# Patient Record
Sex: Male | Born: 1935 | Race: White | Hispanic: No | Marital: Married | Smoking: Never smoker
Health system: Southern US, Community
[De-identification: ages and names within clinical notes are randomized; demographics above are authoritative.]

## PROBLEM LIST (undated history)

## (undated) DIAGNOSIS — G629 Polyneuropathy, unspecified: Secondary | ICD-10-CM

## (undated) DIAGNOSIS — N183 Chronic kidney disease, stage 3 unspecified: Secondary | ICD-10-CM

## (undated) DIAGNOSIS — C61 Malignant neoplasm of prostate: Secondary | ICD-10-CM

## (undated) DIAGNOSIS — I10 Essential (primary) hypertension: Secondary | ICD-10-CM

## (undated) DIAGNOSIS — E039 Hypothyroidism, unspecified: Secondary | ICD-10-CM

## (undated) DIAGNOSIS — S72001A Fracture of unspecified part of neck of right femur, initial encounter for closed fracture: Secondary | ICD-10-CM

## (undated) DIAGNOSIS — J302 Other seasonal allergic rhinitis: Secondary | ICD-10-CM

## (undated) DIAGNOSIS — G2581 Restless legs syndrome: Secondary | ICD-10-CM

## (undated) DIAGNOSIS — I639 Cerebral infarction, unspecified: Secondary | ICD-10-CM

## (undated) DIAGNOSIS — D649 Anemia, unspecified: Secondary | ICD-10-CM

## (undated) DIAGNOSIS — K2970 Gastritis, unspecified, without bleeding: Secondary | ICD-10-CM

## (undated) DIAGNOSIS — I5032 Chronic diastolic (congestive) heart failure: Secondary | ICD-10-CM

## (undated) HISTORY — PX: CARPAL TUNNEL RELEASE: SHX101

## (undated) HISTORY — DX: Malignant neoplasm of prostate: C61

## (undated) HISTORY — DX: Polyneuropathy, unspecified: G62.9

## (undated) HISTORY — DX: Restless legs syndrome: G25.81

## (undated) HISTORY — PX: TRANSURETHRAL RESECTION OF PROSTATE: SHX73

## (undated) HISTORY — DX: Cerebral infarction, unspecified: I63.9

## (undated) HISTORY — DX: Gastritis, unspecified, without bleeding: K29.70

## (undated) HISTORY — DX: Other seasonal allergic rhinitis: J30.2

## (undated) HISTORY — PX: UPPER GI ENDOSCOPY: SHX6162

## (undated) HISTORY — PX: HERNIA REPAIR: SHX51

## (undated) HISTORY — PX: CATARACT EXTRACTION: SUR2

## (undated) HISTORY — PX: APPENDECTOMY: SHX54

## (undated) HISTORY — PX: COLONOSCOPY: SHX174

## (undated) HISTORY — DX: Essential (primary) hypertension: I10

## (undated) HISTORY — DX: Anemia, unspecified: D64.9

---

## 2004-07-26 ENCOUNTER — Ambulatory Visit: Payer: Self-pay | Admitting: Chiropractic Medicine

## 2004-12-21 ENCOUNTER — Ambulatory Visit: Payer: Self-pay | Admitting: Neurology

## 2007-01-20 ENCOUNTER — Ambulatory Visit: Payer: Self-pay | Admitting: Family Medicine

## 2008-01-26 ENCOUNTER — Ambulatory Visit: Payer: Self-pay | Admitting: Unknown Physician Specialty

## 2008-04-13 ENCOUNTER — Ambulatory Visit: Payer: Self-pay | Admitting: Nephrology

## 2008-08-27 ENCOUNTER — Ambulatory Visit: Payer: Self-pay | Admitting: Internal Medicine

## 2008-08-30 ENCOUNTER — Ambulatory Visit: Payer: Self-pay | Admitting: Internal Medicine

## 2008-09-30 ENCOUNTER — Ambulatory Visit: Payer: Self-pay | Admitting: Internal Medicine

## 2008-10-05 ENCOUNTER — Ambulatory Visit: Payer: Self-pay | Admitting: Internal Medicine

## 2008-10-30 ENCOUNTER — Ambulatory Visit: Payer: Self-pay | Admitting: Internal Medicine

## 2008-11-30 ENCOUNTER — Ambulatory Visit: Payer: Self-pay | Admitting: Internal Medicine

## 2008-12-30 ENCOUNTER — Ambulatory Visit: Payer: Self-pay | Admitting: Internal Medicine

## 2009-01-30 ENCOUNTER — Ambulatory Visit: Payer: Self-pay | Admitting: Internal Medicine

## 2009-03-02 ENCOUNTER — Ambulatory Visit: Payer: Self-pay | Admitting: Internal Medicine

## 2009-04-01 ENCOUNTER — Ambulatory Visit: Payer: Self-pay | Admitting: Internal Medicine

## 2009-05-02 ENCOUNTER — Ambulatory Visit: Payer: Self-pay | Admitting: Internal Medicine

## 2009-06-01 ENCOUNTER — Ambulatory Visit: Payer: Self-pay | Admitting: Internal Medicine

## 2009-07-02 ENCOUNTER — Ambulatory Visit: Payer: Self-pay | Admitting: Internal Medicine

## 2009-08-02 ENCOUNTER — Ambulatory Visit: Payer: Self-pay | Admitting: Internal Medicine

## 2009-08-30 ENCOUNTER — Ambulatory Visit: Payer: Self-pay | Admitting: Internal Medicine

## 2009-09-30 ENCOUNTER — Ambulatory Visit: Payer: Self-pay | Admitting: Internal Medicine

## 2009-10-30 ENCOUNTER — Ambulatory Visit: Payer: Self-pay | Admitting: Internal Medicine

## 2009-11-30 ENCOUNTER — Ambulatory Visit: Payer: Self-pay | Admitting: Internal Medicine

## 2009-12-30 ENCOUNTER — Ambulatory Visit: Payer: Self-pay | Admitting: Internal Medicine

## 2010-01-30 ENCOUNTER — Ambulatory Visit: Payer: Self-pay | Admitting: Internal Medicine

## 2010-02-28 ENCOUNTER — Encounter: Payer: Self-pay | Admitting: Orthopedic Surgery

## 2010-03-02 ENCOUNTER — Ambulatory Visit: Payer: Self-pay | Admitting: Internal Medicine

## 2010-03-02 ENCOUNTER — Encounter: Payer: Self-pay | Admitting: Orthopedic Surgery

## 2010-04-01 ENCOUNTER — Ambulatory Visit: Payer: Self-pay | Admitting: Internal Medicine

## 2010-05-02 ENCOUNTER — Ambulatory Visit: Payer: Self-pay | Admitting: Internal Medicine

## 2010-06-01 ENCOUNTER — Ambulatory Visit: Payer: Self-pay | Admitting: Internal Medicine

## 2010-07-02 ENCOUNTER — Ambulatory Visit: Payer: Self-pay | Admitting: Internal Medicine

## 2010-08-02 ENCOUNTER — Ambulatory Visit: Payer: Self-pay | Admitting: Internal Medicine

## 2010-09-07 ENCOUNTER — Ambulatory Visit: Payer: Self-pay | Admitting: Internal Medicine

## 2010-10-01 ENCOUNTER — Ambulatory Visit: Payer: Self-pay | Admitting: Internal Medicine

## 2010-11-03 ENCOUNTER — Ambulatory Visit: Payer: Self-pay | Admitting: Internal Medicine

## 2010-12-01 ENCOUNTER — Ambulatory Visit: Payer: Self-pay | Admitting: Internal Medicine

## 2010-12-31 ENCOUNTER — Ambulatory Visit: Payer: Self-pay | Admitting: Internal Medicine

## 2011-02-23 ENCOUNTER — Ambulatory Visit: Payer: Self-pay | Admitting: Internal Medicine

## 2011-03-03 ENCOUNTER — Ambulatory Visit: Payer: Self-pay | Admitting: Internal Medicine

## 2011-04-20 ENCOUNTER — Ambulatory Visit: Payer: Self-pay | Admitting: Internal Medicine

## 2011-05-03 ENCOUNTER — Ambulatory Visit: Payer: Self-pay | Admitting: Internal Medicine

## 2011-06-15 ENCOUNTER — Ambulatory Visit: Payer: Self-pay | Admitting: Internal Medicine

## 2011-07-03 ENCOUNTER — Ambulatory Visit: Payer: Self-pay | Admitting: Internal Medicine

## 2011-08-10 ENCOUNTER — Ambulatory Visit: Payer: Self-pay | Admitting: Internal Medicine

## 2011-08-10 LAB — CANCER CENTER HEMOGLOBIN: HGB: 10.7 g/dL — ABNORMAL LOW (ref 13.0–18.0)

## 2011-08-31 ENCOUNTER — Ambulatory Visit: Payer: Self-pay | Admitting: Internal Medicine

## 2011-10-05 ENCOUNTER — Ambulatory Visit: Payer: Self-pay | Admitting: Internal Medicine

## 2011-10-05 LAB — CBC CANCER CENTER
Basophil %: 1.4 %
Eosinophil #: 0.1 x10 3/mm (ref 0.0–0.7)
Lymphocyte #: 2.2 x10 3/mm (ref 1.0–3.6)
Lymphocyte %: 30.7 %
MCV: 92 fL (ref 80–100)
Monocyte #: 0.4 x10 3/mm (ref 0.0–0.7)
Monocyte %: 6 %
Neutrophil #: 4.3 x10 3/mm (ref 1.4–6.5)
Neutrophil %: 60.1 %
Platelet: 307 x10 3/mm (ref 150–440)
RBC: 3.29 10*6/uL — ABNORMAL LOW (ref 4.40–5.90)
RDW: 15.1 % — ABNORMAL HIGH (ref 11.5–14.5)
WBC: 7.2 x10 3/mm (ref 3.8–10.6)

## 2011-10-05 LAB — FERRITIN: Ferritin (ARMC): 54 ng/mL (ref 8–388)

## 2011-10-05 LAB — RETICULOCYTES: Reticulocyte: 1 % (ref 0.5–1.5)

## 2011-10-31 ENCOUNTER — Ambulatory Visit: Payer: Self-pay | Admitting: Internal Medicine

## 2011-11-30 ENCOUNTER — Ambulatory Visit: Payer: Self-pay | Admitting: Family Medicine

## 2011-12-01 ENCOUNTER — Ambulatory Visit: Payer: Self-pay | Admitting: Internal Medicine

## 2012-01-04 ENCOUNTER — Ambulatory Visit: Payer: Self-pay | Admitting: Internal Medicine

## 2012-01-04 LAB — CANCER CENTER HEMOGLOBIN: HGB: 10.7 g/dL — ABNORMAL LOW (ref 13.0–18.0)

## 2012-01-07 ENCOUNTER — Ambulatory Visit: Payer: Self-pay | Admitting: Ophthalmology

## 2012-01-07 LAB — CBC WITH DIFFERENTIAL/PLATELET
Basophil #: 0 10*3/uL (ref 0.0–0.1)
Basophil %: 0.7 %
Eosinophil %: 1.4 %
HCT: 31.8 % — ABNORMAL LOW (ref 40.0–52.0)
HGB: 10.5 g/dL — ABNORMAL LOW (ref 13.0–18.0)
Lymphocyte #: 1.9 10*3/uL (ref 1.0–3.6)
Lymphocyte %: 29 %
MCH: 30.3 pg (ref 26.0–34.0)
MCV: 92 fL (ref 80–100)
Monocyte %: 10.5 %
Neutrophil #: 3.8 10*3/uL (ref 1.4–6.5)
RDW: 15.7 % — ABNORMAL HIGH (ref 11.5–14.5)
WBC: 6.5 10*3/uL (ref 3.8–10.6)

## 2012-01-15 ENCOUNTER — Ambulatory Visit: Payer: Self-pay | Admitting: Ophthalmology

## 2012-01-31 ENCOUNTER — Ambulatory Visit: Payer: Self-pay | Admitting: Internal Medicine

## 2012-02-12 LAB — CANCER CENTER HEMOGLOBIN: HGB: 10.8 g/dL — ABNORMAL LOW (ref 13.0–18.0)

## 2012-03-02 ENCOUNTER — Ambulatory Visit: Payer: Self-pay | Admitting: Internal Medicine

## 2012-03-21 LAB — FERRITIN: Ferritin (ARMC): 51 ng/mL (ref 8–388)

## 2012-03-21 LAB — CANCER CENTER HEMOGLOBIN: HGB: 11.3 g/dL — ABNORMAL LOW (ref 13.0–18.0)

## 2012-03-21 LAB — FOLATE: Folic Acid: 87.7 ng/mL (ref 3.1–100.0)

## 2012-03-21 LAB — IRON AND TIBC: Iron Saturation: 23 %

## 2012-04-01 ENCOUNTER — Ambulatory Visit: Payer: Self-pay | Admitting: Internal Medicine

## 2012-04-22 ENCOUNTER — Ambulatory Visit: Payer: Self-pay | Admitting: Ophthalmology

## 2012-05-02 ENCOUNTER — Ambulatory Visit: Payer: Self-pay | Admitting: Internal Medicine

## 2012-05-02 LAB — CBC CANCER CENTER
Basophil #: 0 x10 3/mm (ref 0.0–0.1)
Eosinophil #: 0.2 x10 3/mm (ref 0.0–0.7)
HGB: 11.1 g/dL — ABNORMAL LOW (ref 13.0–18.0)
Lymphocyte #: 4.2 x10 3/mm — ABNORMAL HIGH (ref 1.0–3.6)
Lymphocyte %: 47.2 %
MCHC: 32.9 g/dL (ref 32.0–36.0)
Monocyte %: 7.9 %
Neutrophil %: 42.2 %
Platelet: 307 x10 3/mm (ref 150–440)
RDW: 14.9 % — ABNORMAL HIGH (ref 11.5–14.5)
WBC: 8.9 x10 3/mm (ref 3.8–10.6)

## 2012-05-08 ENCOUNTER — Ambulatory Visit: Payer: Self-pay | Admitting: Gastroenterology

## 2012-06-01 ENCOUNTER — Ambulatory Visit: Payer: Self-pay | Admitting: Internal Medicine

## 2012-06-13 LAB — CANCER CENTER HEMOGLOBIN: HGB: 11.2 g/dL — ABNORMAL LOW (ref 13.0–18.0)

## 2012-07-02 ENCOUNTER — Ambulatory Visit: Payer: Self-pay | Admitting: Internal Medicine

## 2012-07-02 DIAGNOSIS — I639 Cerebral infarction, unspecified: Secondary | ICD-10-CM

## 2012-07-02 HISTORY — DX: Cerebral infarction, unspecified: I63.9

## 2012-07-09 ENCOUNTER — Ambulatory Visit: Payer: Self-pay | Admitting: Family Medicine

## 2012-07-10 ENCOUNTER — Other Ambulatory Visit: Payer: Self-pay

## 2012-07-10 LAB — CBC WITH DIFFERENTIAL/PLATELET
Basophil #: 0 10*3/uL (ref 0.0–0.1)
Basophil %: 0.4 %
Eosinophil #: 0.1 10*3/uL (ref 0.0–0.7)
Eosinophil %: 1.7 %
HCT: 33.7 % — ABNORMAL LOW (ref 40.0–52.0)
MCHC: 32.3 g/dL (ref 32.0–36.0)
MCV: 94 fL (ref 80–100)
Monocyte %: 7.6 %
Neutrophil %: 46.4 %
Platelet: 349 10*3/uL (ref 150–440)
RBC: 3.59 10*6/uL — ABNORMAL LOW (ref 4.40–5.90)
RDW: 14.3 % (ref 11.5–14.5)
WBC: 8.7 10*3/uL (ref 3.8–10.6)

## 2012-07-10 LAB — COMPREHENSIVE METABOLIC PANEL
Anion Gap: 6 — ABNORMAL LOW (ref 7–16)
Bilirubin,Total: 0.5 mg/dL (ref 0.2–1.0)
Calcium, Total: 9.1 mg/dL (ref 8.5–10.1)
Chloride: 98 mmol/L (ref 98–107)
Co2: 30 mmol/L (ref 21–32)
Creatinine: 1.91 mg/dL — ABNORMAL HIGH (ref 0.60–1.30)
EGFR (African American): 35 — ABNORMAL LOW
EGFR (Non-African Amer.): 30 — ABNORMAL LOW
Osmolality: 272 (ref 275–301)
SGOT(AST): 26 U/L (ref 15–37)
SGPT (ALT): 19 U/L (ref 12–78)
Sodium: 134 mmol/L — ABNORMAL LOW (ref 136–145)

## 2012-07-25 LAB — CANCER CENTER HEMOGLOBIN: HGB: 11.9 g/dL — ABNORMAL LOW (ref 13.0–18.0)

## 2012-08-02 ENCOUNTER — Ambulatory Visit: Payer: Self-pay | Admitting: Internal Medicine

## 2012-09-05 ENCOUNTER — Ambulatory Visit: Payer: Self-pay | Admitting: Internal Medicine

## 2012-09-24 ENCOUNTER — Encounter: Payer: Self-pay | Admitting: Cardiovascular Disease

## 2012-09-24 ENCOUNTER — Ambulatory Visit (INDEPENDENT_AMBULATORY_CARE_PROVIDER_SITE_OTHER): Payer: Medicare HMO | Admitting: Cardiovascular Disease

## 2012-09-24 VITALS — BP 150/62 | HR 78 | Ht 62.0 in | Wt 159.2 lb

## 2012-09-24 DIAGNOSIS — I10 Essential (primary) hypertension: Secondary | ICD-10-CM

## 2012-09-24 DIAGNOSIS — R609 Edema, unspecified: Secondary | ICD-10-CM

## 2012-09-24 DIAGNOSIS — R002 Palpitations: Secondary | ICD-10-CM

## 2012-09-24 DIAGNOSIS — R0602 Shortness of breath: Secondary | ICD-10-CM

## 2012-09-24 MED ORDER — LOSARTAN POTASSIUM 50 MG PO TABS: 50.0000 mg | ORAL_TABLET | Freq: Every day | ORAL | Status: DC

## 2012-09-24 NOTE — Assessment & Plan Note (Signed)
Edema improved after recent medication changes and starting Lasix. We have suggested he slowly back down on his Lasix

## 2012-09-24 NOTE — Assessment & Plan Note (Signed)
Palpitations have improved on verapamil. We'll hold off on adding beta blockers as he is asymptomatic

## 2012-09-24 NOTE — Progress Notes (Signed)
Patient ID: Jeremiah Bates, male    DOB: 05-08-1920, 79 y.o.   MRN: [id number]  HPI Comments: Jeremiah Bates is a very pleasant 79 year old gentleman, patient of Dr. Juanetta Gosling, presenting with symptoms of shortness of breath, palpitations. No prior cardiac history per the patient. 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  He reports his shortness of breath has improved on his current medication regimen. He reports that he was started on one medication and this caused leg edema. Blood pressure medication was changed and started on Lasix and his leg edema resolved. Currently takes Lasix daily with improved shortness of breath, improved edema. He does not check his blood pressure at home. Since he has been on verapamil 180 mg daily, his palpitations have improved. He wonders if it is okay for him to travel to [place].  EKG in Dr. Juanetta Gosling office shows normal sinus rhythm with rate 63 beats per minute with no significant ST or T wave changes.  EKG today shows normal sinus rhythmWith rate 78 beats per minute with first degree AV block, no significant ST or T wave changes EKG from [facility] July 2013 shows normal sinus rhythm. Essentially unchanged from today's EKG  Recent chest x-ray is essentially unremarkable   Outpatient Encounter Prescriptions as of 09/24/2012  Medication Sig Dispense Refill  . albuterol (PROVENTIL HFA;VENTOLIN HFA) 108 (90 BASE) MCG/ACT inhaler Inhale 2 puffs into the lungs every 6 (six) hours as needed for wheezing.      Marland Kitchen aspirin 81 MG tablet Take 81 mg by mouth daily.      . calcium-vitamin D (OSCAL WITH D) 250-125 MG-UNIT per tablet Take 1 tablet by mouth daily.      . cholecalciferol (VITAMIN D) 400 UNITS TABS Take 400 Units by mouth daily.      . Ferrous Sulfate (IRON) 28 MG TABS Take 28 mg by mouth daily.      . furosemide (LASIX) 10 MG/ML solution Take by mouth daily.      . Melatonin 3 MG CAPS Take 3 mg by mouth at bedtime.      . NON FORMULARY [place] takes  1 tablet  twice daily.      . Omega-3 Fatty Acids (FISH OIL) 1200 MG CAPS Take 1,200 mg by mouth daily.      . pramipexole (MIRAPEX) 1 MG tablet Takes 1 tablet am, 1 tablet afternoon and 2 tablets at bedtime daily.      . verapamil (CALAN-SR) 180 MG CR tablet Take 180 mg by mouth at bedtime.      . vitamin E 400 UNIT capsule Take 400 Units by mouth daily.        Review of Systems  Constitutional: Negative.   HENT: Negative.   Eyes: Negative.   Respiratory: Positive for shortness of breath.   Cardiovascular: Positive for leg swelling.       Edema and shortness of breath have improved  Gastrointestinal: Negative.   Musculoskeletal: Negative.   Skin: Negative.   Neurological: Negative.   Psychiatric/Behavioral: Negative.   All other systems reviewed and are negative.    BP 150/62  Pulse 78  Ht 5\' 2"  (1.575 m)  Wt 159 lb 4 oz (72.235 kg)  BMI 29.12 kg/m2 Repeat blood pressures consistent with initial blood pressure  Physical Exam  Nursing note and vitals reviewed. Constitutional: He is oriented to person, place, and time. He appears well-developed and well-nourished.  HENT:  Head: Normocephalic.  Nose: Nose normal.  Mouth/Throat: Oropharynx  is clear and moist.  Eyes: Conjunctivae are normal. Pupils are equal, round, and reactive to light.  Neck: Normal range of motion. Neck supple. No JVD present.  Cardiovascular: Normal rate, regular rhythm, S1 normal, S2 normal and intact distal pulses.  Exam reveals no gallop and no friction rub.   Murmur heard.  Crescendo systolic murmur is present with a grade of 1/6  Pulmonary/Chest: Effort normal and breath sounds normal. No respiratory distress. He has no wheezes. He has no rales. He exhibits no tenderness.  Abdominal: Soft. Bowel sounds are normal. He exhibits no distension. There is no tenderness.  Musculoskeletal: Normal range of motion. He exhibits no edema and no tenderness.  Lymphadenopathy:    He has no cervical adenopathy.   Neurological: He is alert and oriented to person, place, and time. Coordination normal.  Skin: Skin is warm and dry. No rash noted. No erythema.  Psychiatric: He has a normal mood and affect. His behavior is normal. Judgment and thought content normal.      Assessment and Plan

## 2012-09-24 NOTE — Patient Instructions (Addendum)
You are doing well. Please start losartan 50 mg daily for high blood pressure  Please decrease the furosemide/lasix to every other day (possibly Monday/wednesday and Friday)  We will schedule an echocardiogram for the middle of April when you come back form [place]  Please call us if you have new issues that need to be addressed before your next appt.  Your physician wants you to follow-up in: 3 months.  You will receive a reminder letter in the mail two months in advance. If you don't receive a letter, please call our office to schedule the follow-up appointment.

## 2012-09-24 NOTE — Assessment & Plan Note (Signed)
Blood pressure continues to be elevated today. We will start losartan 50 mg daily. This can be titrated upwards as needed.

## 2012-09-24 NOTE — Assessment & Plan Note (Signed)
He reports improved shortness of breath. He is taking Lasix daily. No edema. We have suggested he take Lasix every other day, possibly 3 days per week to avoid dehydration. He can take additional Lasix if needed for worsening shortness of breath or edema. Unable to exclude diastolic CHF. Echocardiogram has been ordered and will be done when he gets back from [place] in April.

## 2012-10-10 ENCOUNTER — Emergency Department: Payer: Self-pay | Admitting: Emergency Medicine

## 2012-10-10 LAB — COMPREHENSIVE METABOLIC PANEL
Albumin: 3.4 g/dL (ref 3.4–5.0)
Anion Gap: 8 (ref 7–16)
BUN: 37 mg/dL — ABNORMAL HIGH (ref 7–18)
Bilirubin,Total: 0.2 mg/dL (ref 0.2–1.0)
Calcium, Total: 8.7 mg/dL (ref 8.5–10.1)
Chloride: 98 mmol/L (ref 98–107)
Co2: 26 mmol/L (ref 21–32)
EGFR (African American): 41 — ABNORMAL LOW
EGFR (Non-African Amer.): 35 — ABNORMAL LOW
Glucose: 131 mg/dL — ABNORMAL HIGH (ref 65–99)
SGOT(AST): 28 U/L (ref 15–37)
SGPT (ALT): 23 U/L (ref 12–78)

## 2012-10-10 LAB — URINALYSIS, COMPLETE
Blood: NEGATIVE
Glucose,UR: NEGATIVE mg/dL (ref 0–75)
Ketone: NEGATIVE
Ph: 6 (ref 4.5–8.0)
RBC,UR: 1 /HPF (ref 0–5)
Specific Gravity: 1.011 (ref 1.003–1.030)
Squamous Epithelial: NONE SEEN

## 2012-10-10 LAB — CBC
HCT: 34.8 % — ABNORMAL LOW (ref 40.0–52.0)
HGB: 11.7 g/dL — ABNORMAL LOW (ref 13.0–18.0)
MCV: 92 fL (ref 80–100)
Platelet: 370 10*3/uL (ref 150–440)
RBC: 3.78 10*6/uL — ABNORMAL LOW (ref 4.40–5.90)

## 2012-10-10 LAB — TROPONIN I: Troponin-I: <0.02 ng/mL

## 2012-10-16 ENCOUNTER — Other Ambulatory Visit: Payer: Medicare HMO

## 2012-10-20 ENCOUNTER — Ambulatory Visit: Payer: Self-pay | Admitting: Internal Medicine

## 2012-10-20 LAB — FERRITIN: Ferritin (ARMC): 41 ng/mL (ref 8–388)

## 2012-10-20 LAB — CANCER CENTER HEMOGLOBIN: HGB: 10.7 g/dL — ABNORMAL LOW (ref 13.0–18.0)

## 2012-10-20 LAB — IRON AND TIBC: Unbound Iron-Bind.Cap.: 274 ug/dL

## 2012-10-30 ENCOUNTER — Ambulatory Visit: Payer: Self-pay | Admitting: Internal Medicine

## 2012-11-04 ENCOUNTER — Other Ambulatory Visit (INDEPENDENT_AMBULATORY_CARE_PROVIDER_SITE_OTHER): Payer: Medicare HMO

## 2012-11-04 ENCOUNTER — Other Ambulatory Visit: Payer: Self-pay

## 2012-11-04 DIAGNOSIS — R609 Edema, unspecified: Secondary | ICD-10-CM

## 2012-11-04 DIAGNOSIS — R0602 Shortness of breath: Secondary | ICD-10-CM

## 2012-11-04 DIAGNOSIS — R002 Palpitations: Secondary | ICD-10-CM

## 2012-11-14 ENCOUNTER — Ambulatory Visit: Payer: Self-pay | Admitting: Gastroenterology

## 2012-11-15 ENCOUNTER — Emergency Department: Payer: Self-pay | Admitting: Internal Medicine

## 2012-11-15 LAB — CBC
HCT: 31.2 % — ABNORMAL LOW (ref 40.0–52.0)
HGB: 10.7 g/dL — ABNORMAL LOW (ref 13.0–18.0)
MCH: 31.5 pg (ref 26.0–34.0)
MCV: 92 fL (ref 80–100)
Platelet: 300 10*3/uL (ref 150–440)
RBC: 3.4 10*6/uL — ABNORMAL LOW (ref 4.40–5.90)
RDW: 15.1 % — ABNORMAL HIGH (ref 11.5–14.5)
WBC: 10 10*3/uL (ref 3.8–10.6)

## 2012-11-15 LAB — COMPREHENSIVE METABOLIC PANEL
Albumin: 3.3 g/dL — ABNORMAL LOW (ref 3.4–5.0)
Anion Gap: 7 (ref 7–16)
BUN: 26 mg/dL — ABNORMAL HIGH (ref 7–18)
Calcium, Total: 8.6 mg/dL (ref 8.5–10.1)
Co2: 26 mmol/L (ref 21–32)
EGFR (African American): 37 — ABNORMAL LOW
EGFR (Non-African Amer.): 32 — ABNORMAL LOW
Glucose: 117 mg/dL — ABNORMAL HIGH (ref 65–99)
Osmolality: 272 (ref 275–301)
SGOT(AST): 27 U/L (ref 15–37)
SGPT (ALT): 18 U/L (ref 12–78)
Sodium: 133 mmol/L — ABNORMAL LOW (ref 136–145)
Total Protein: 7.5 g/dL (ref 6.4–8.2)

## 2012-11-15 LAB — PROTIME-INR
INR: 1
Prothrombin Time: 13.6 secs (ref 11.5–14.7)

## 2012-12-02 ENCOUNTER — Ambulatory Visit: Payer: Self-pay | Admitting: Internal Medicine

## 2012-12-05 LAB — CBC CANCER CENTER
Basophil #: 0 x10 3/mm (ref 0.0–0.1)
Eosinophil #: 0.1 x10 3/mm (ref 0.0–0.7)
HCT: 32 % — ABNORMAL LOW (ref 40.0–52.0)
HGB: 10.9 g/dL — ABNORMAL LOW (ref 13.0–18.0)
Lymphocyte #: 3.6 x10 3/mm (ref 1.0–3.6)
MCH: 31.5 pg (ref 26.0–34.0)
Monocyte %: 7.1 %
Neutrophil #: 6.1 x10 3/mm (ref 1.4–6.5)
Neutrophil %: 57.6 %
Platelet: 387 x10 3/mm (ref 150–440)
RBC: 3.44 10*6/uL — ABNORMAL LOW (ref 4.40–5.90)
RDW: 15.1 % — ABNORMAL HIGH (ref 11.5–14.5)

## 2012-12-08 ENCOUNTER — Telehealth: Payer: Self-pay | Admitting: Cardiovascular Disease

## 2012-12-08 NOTE — Telephone Encounter (Signed)
Please call has a question on Rx: Losartin 50mg 

## 2012-12-25 ENCOUNTER — Ambulatory Visit (INDEPENDENT_AMBULATORY_CARE_PROVIDER_SITE_OTHER): Payer: Medicare HMO | Admitting: Cardiovascular Disease

## 2012-12-25 ENCOUNTER — Encounter: Payer: Self-pay | Admitting: Cardiovascular Disease

## 2012-12-25 VITALS — BP 155/72 | HR 85 | Ht 62.0 in | Wt 159.2 lb

## 2012-12-25 DIAGNOSIS — R002 Palpitations: Secondary | ICD-10-CM

## 2012-12-25 DIAGNOSIS — I10 Essential (primary) hypertension: Secondary | ICD-10-CM

## 2012-12-25 DIAGNOSIS — R609 Edema, unspecified: Secondary | ICD-10-CM

## 2012-12-25 DIAGNOSIS — R0602 Shortness of breath: Secondary | ICD-10-CM

## 2012-12-25 MED ORDER — FUROSEMIDE 20 MG PO TABS: 20.0000 mg | ORAL_TABLET | Freq: Every day | ORAL | Status: DC | PRN

## 2012-12-25 NOTE — Assessment & Plan Note (Signed)
Initial blood pressure was mildly elevated. Repeat blood pressure improved. No medication changes made today.

## 2012-12-25 NOTE — Assessment & Plan Note (Signed)
Shortness of breath is resolved. Recent normal echocardiogram. If symptoms recur, would consider ischemia workup with stress testing.

## 2012-12-25 NOTE — Patient Instructions (Signed)
You are doing well.  Please take lasix/furosemide as needed, only for significant leg swelling Ok to take one a day until swelling goes away  Please call us if you have new issues that need to be addressed before your next appt.  Your physician wants you to follow-up in: 12 months.  You will receive a reminder letter in the mail two months in advance. If you don't receive a letter, please call our office to schedule the follow-up appointment.

## 2012-12-25 NOTE — Assessment & Plan Note (Signed)
No further edema on exam today. We did suggest he take Lasix  only when necessary for worsening lower extremity edema or shortness of breath.

## 2012-12-25 NOTE — Progress Notes (Signed)
Patient ID: Jeremiah Bates, male    DOB: Nov 29, 1919, 79 y.o.   MRN: [id number]  HPI Comments: Mr. Jeremiah Bates is a very pleasant 79 year old gentleman, patient of Dr. Juanetta Gosling, with a history of hypertension, mild diastolic CHF,   previous symptoms of shortness of breath, palpitations. He presents for routine followup.  No prior cardiac history per the patient. 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 He presents today and reports that he no longer takes Lasix. He does not have any lower extremity edema. Overall feels well with no complaints.  He takes verapamil and losartan for his blood pressure.  Recent echocardiogram was essentially normal. No significant valvular disease. Normal right ventricular systolic pressures.  EKG shows normal sinus rhythm with rate 85 beats per minute with no significant ST or T wave changes  Previous chest x-ray is essentially unremarkable   Outpatient Encounter Prescriptions as of 12/25/2012  Medication Sig Dispense Refill  . albuterol (PROVENTIL HFA;VENTOLIN HFA) 108 (90 BASE) MCG/ACT inhaler Inhale 2 puffs into the lungs every 6 (six) hours as needed for wheezing.      Marland Kitchen aspirin 81 MG tablet Take 81 mg by mouth daily.      . calcium-vitamin D (OSCAL WITH D) 250-125 MG-UNIT per tablet Take 1 tablet by mouth daily.      . cholecalciferol (VITAMIN D) 1000 UNITS tablet Take 1,000 Units by mouth daily.      . Cyanocobalamin (VITAMIN B 12 PO) Take 2,500 mcg by mouth daily.      . Ferrous Sulfate (IRON) 28 MG TABS Take 28 mg by mouth daily.      . furosemide (LASIX) 10 MG/ML solution Take by mouth daily.      Marland Kitchen losartan (COZAAR) 50 MG tablet Take 1 tablet (50 mg total) by mouth daily.  30 tablet  6  . Melatonin 3 MG CAPS Take 3 mg by mouth at bedtime.      . NON FORMULARY [place] takes 1 tablet  twice daily.      . Omega-3 Fatty Acids (FISH OIL) 1000 MG CAPS Take 1,000 mg by mouth daily.      . pramipexole (MIRAPEX) 1 MG tablet Takes 1 tablet am, 1  tablet afternoon and 2 tablets at bedtime daily.      . tamsulosin (FLOMAX) 0.4 MG CAPS Take 0.4 mg by mouth daily.      . verapamil (CALAN-SR) 180 MG CR tablet Take 180 mg by mouth at bedtime.      . vitamin E 400 UNIT capsule Take 400 Units by mouth daily.        Review of Systems  Constitutional: Negative.   HENT: Negative.   Eyes: Negative.   Gastrointestinal: Negative.   Musculoskeletal: Negative.   Skin: Negative.   Neurological: Negative.   Psychiatric/Behavioral: Negative.   All other systems reviewed and are negative.    BP 155/72  Pulse 85  Ht 5\' 2"  (1.575 m)  Wt 159 lb 4 oz (72.235 kg)  BMI 29.12 kg/m2 Repeat blood pressure 140/70 Physical Exam  Nursing note and vitals reviewed. Constitutional: He is oriented to person, place, and time. He appears well-developed and well-nourished.  HENT:  Head: Normocephalic.  Nose: Nose normal.  Mouth/Throat: Oropharynx is clear and moist.  Eyes: Conjunctivae are normal. Pupils are equal, round, and reactive to light.  Neck: Normal range of motion. Neck supple. No JVD present.  Cardiovascular: Normal rate, regular rhythm, S1 normal, S2 normal and intact distal  pulses.  Exam reveals no gallop and no friction rub.   Murmur heard.  Crescendo systolic murmur is present with a grade of 1/6  Pulmonary/Chest: Effort normal and breath sounds normal. No respiratory distress. He has no wheezes. He has no rales. He exhibits no tenderness.  Abdominal: Soft. Bowel sounds are normal. He exhibits no distension. There is no tenderness.  Musculoskeletal: Normal range of motion. He exhibits no edema and no tenderness.  Lymphadenopathy:    He has no cervical adenopathy.  Neurological: He is alert and oriented to person, place, and time. Coordination normal.  Skin: Skin is warm and dry. No rash noted. No erythema.  Psychiatric: He has a normal mood and affect. His behavior is normal. Judgment and thought content normal.      Assessment and  Plan

## 2012-12-30 ENCOUNTER — Ambulatory Visit: Payer: Self-pay | Admitting: Internal Medicine

## 2013-02-24 ENCOUNTER — Ambulatory Visit: Payer: Self-pay | Admitting: Internal Medicine

## 2013-03-02 ENCOUNTER — Ambulatory Visit: Payer: Self-pay | Admitting: Internal Medicine

## 2013-03-06 ENCOUNTER — Other Ambulatory Visit: Payer: Self-pay

## 2013-03-06 DIAGNOSIS — R002 Palpitations: Secondary | ICD-10-CM

## 2013-03-06 DIAGNOSIS — I639 Cerebral infarction, unspecified: Secondary | ICD-10-CM

## 2013-03-10 ENCOUNTER — Ambulatory Visit: Payer: Self-pay | Admitting: Neurology

## 2013-03-20 ENCOUNTER — Telehealth: Payer: Self-pay

## 2013-03-20 NOTE — Telephone Encounter (Signed)
Spoke w/ pt.  He is aware of results.  

## 2013-03-20 NOTE — Telephone Encounter (Signed)
Pt would like holter monitor results. STates he has been waiting a week.

## 2013-03-23 ENCOUNTER — Ambulatory Visit (INDEPENDENT_AMBULATORY_CARE_PROVIDER_SITE_OTHER): Payer: Medicare HMO

## 2013-03-23 DIAGNOSIS — I6350 Cerebral infarction due to unspecified occlusion or stenosis of unspecified cerebral artery: Secondary | ICD-10-CM

## 2013-03-23 DIAGNOSIS — I639 Cerebral infarction, unspecified: Secondary | ICD-10-CM

## 2013-03-23 DIAGNOSIS — R002 Palpitations: Secondary | ICD-10-CM

## 2013-04-09 ENCOUNTER — Ambulatory Visit: Payer: Self-pay | Admitting: Family Medicine

## 2013-04-27 ENCOUNTER — Other Ambulatory Visit: Payer: Self-pay

## 2013-04-27 MED ORDER — LOSARTAN POTASSIUM 50 MG PO TABS: 50.0000 mg | ORAL_TABLET | Freq: Every day | ORAL | Status: DC

## 2013-04-27 NOTE — Telephone Encounter (Signed)
Requested Prescriptions   Signed Prescriptions Disp Refills  . losartan (COZAAR) 50 MG tablet 30 tablet 6    Sig: Take 1 tablet (50 mg total) by mouth daily.    Authorizing Provider: Antonieta Iba    Ordering User: Kendrick Fries

## 2013-05-22 ENCOUNTER — Ambulatory Visit: Payer: Self-pay | Admitting: Internal Medicine

## 2013-05-22 LAB — FERRITIN: Ferritin (ARMC): 62 ng/mL (ref 8–388)

## 2013-05-22 LAB — IRON AND TIBC
Iron Bind.Cap.(Total): 295 ug/dL
Iron Saturation: 14 %
Iron: 40 ug/dL — ABNORMAL LOW
Unbound Iron-Bind.Cap.: 255 ug/dL

## 2013-06-01 ENCOUNTER — Ambulatory Visit: Payer: Self-pay | Admitting: Internal Medicine

## 2013-08-13 ENCOUNTER — Ambulatory Visit: Payer: Self-pay | Admitting: Internal Medicine

## 2013-08-21 LAB — CANCER CENTER HEMOGLOBIN: HGB: 10.4 g/dL — ABNORMAL LOW (ref 13.0–18.0)

## 2013-08-30 ENCOUNTER — Ambulatory Visit: Payer: Self-pay | Admitting: Internal Medicine

## 2013-11-09 ENCOUNTER — Ambulatory Visit: Payer: Self-pay | Admitting: Internal Medicine

## 2013-11-09 LAB — IRON AND TIBC
Iron Bind.Cap.(Total): 260 ug/dL (ref 250–450)
Iron Saturation: 23 %
Iron: 59 ug/dL — ABNORMAL LOW (ref 65–175)
Unbound Iron-Bind.Cap.: 201 ug/dL

## 2013-11-09 LAB — CBC CANCER CENTER
Basophil #: 0 x10 3/mm (ref 0.0–0.1)
Basophil %: 0.4 %
EOS ABS: 0.1 x10 3/mm (ref 0.0–0.7)
Eosinophil %: 1.4 %
HCT: 31.7 % — AB (ref 40.0–52.0)
HGB: 10.5 g/dL — ABNORMAL LOW (ref 13.0–18.0)
Lymphocyte #: 2.9 x10 3/mm (ref 1.0–3.6)
Lymphocyte %: 36.9 %
MCH: 31.2 pg (ref 26.0–34.0)
MCHC: 32.9 g/dL (ref 32.0–36.0)
MCV: 95 fL (ref 80–100)
MONO ABS: 0.8 x10 3/mm (ref 0.2–1.0)
Monocyte %: 9.9 %
NEUTROS PCT: 51.4 %
Neutrophil #: 4.1 x10 3/mm (ref 1.4–6.5)
Platelet: 278 x10 3/mm (ref 150–440)
RBC: 3.35 10*6/uL — ABNORMAL LOW (ref 4.40–5.90)
RDW: 14.4 % (ref 11.5–14.5)
WBC: 8 x10 3/mm (ref 3.8–10.6)

## 2013-11-09 LAB — FERRITIN: FERRITIN (ARMC): 57 ng/mL (ref 8–388)

## 2013-11-30 ENCOUNTER — Ambulatory Visit: Payer: Self-pay | Admitting: Internal Medicine

## 2013-11-30 ENCOUNTER — Other Ambulatory Visit: Payer: Self-pay

## 2013-11-30 MED ORDER — LOSARTAN POTASSIUM 50 MG PO TABS: 50.0000 mg | ORAL_TABLET | Freq: Every day | ORAL | Status: DC

## 2013-12-25 ENCOUNTER — Ambulatory Visit: Payer: Medicare HMO | Admitting: Cardiovascular Disease

## 2014-01-04 ENCOUNTER — Ambulatory Visit (INDEPENDENT_AMBULATORY_CARE_PROVIDER_SITE_OTHER): Payer: Medicare HMO | Admitting: Cardiovascular Disease

## 2014-01-04 ENCOUNTER — Encounter: Payer: Self-pay | Admitting: Cardiovascular Disease

## 2014-01-04 VITALS — BP 150/68 | HR 73 | Ht 60.0 in | Wt 160.5 lb

## 2014-01-04 DIAGNOSIS — R0602 Shortness of breath: Secondary | ICD-10-CM

## 2014-01-04 DIAGNOSIS — R609 Edema, unspecified: Secondary | ICD-10-CM

## 2014-01-04 DIAGNOSIS — R002 Palpitations: Secondary | ICD-10-CM

## 2014-01-04 DIAGNOSIS — G2581 Restless legs syndrome: Secondary | ICD-10-CM | POA: Insufficient documentation

## 2014-01-04 DIAGNOSIS — I10 Essential (primary) hypertension: Secondary | ICD-10-CM

## 2014-01-04 DIAGNOSIS — G4700 Insomnia, unspecified: Secondary | ICD-10-CM

## 2014-01-04 NOTE — Assessment & Plan Note (Signed)
Shortness of breath has resolved. No further acute on chronic diastolic CHF episodes

## 2014-01-04 NOTE — Assessment & Plan Note (Signed)
By his account, was previously on gabapentin. Now has insomnia since this was held. Suggested he could try his melatonin, possibly even Benadryl. Start a regular walking program. Followup with primary care

## 2014-01-04 NOTE — Assessment & Plan Note (Signed)
No significant shortness of breath or edema. He is not needing Lasix.

## 2014-01-04 NOTE — Assessment & Plan Note (Signed)
Blood pressure is well controlled on today's visit. No changes made to the medications. 

## 2014-01-04 NOTE — Progress Notes (Signed)
Patient ID: Jeremiah Bates, male    DOB: Oct 29, 1919, 80 y.o.   MRN: [id number]  HPI Comments: Jeremiah Bates is a very pleasant 80 year old gentleman, patient of Dr. Luan Pulling, with a history of hypertension, mild diastolic CHF,   previous symptoms of shortness of breath, palpitations. He presents for routine followup.  In general he reports feeling well. His only complaint is insomnia. He states that his medications were recently changed within the past week or so. Since that time he has not been sleeping as well. No significant sleep for the past 3 days. He is on medication for restless leg. By his account, he was previously on gabapentin which may have worked better for his sleep but perhaps not as good for his restless leg.   He denies any chest pain or shortness of breath. No significant leg edema. He's not taking any Lasix. None for the past 6 weeks with no shortness of breath or leg swelling.  He takes verapamil and losartan for his blood pressure. He denies having any significant palpitations  Recent echocardiogram was essentially normal. No significant valvular disease. Normal right ventricular systolic pressures.  EKG shows normal sinus rhythm with rate 73 beats per minute with no significant ST or T wave changes   Outpatient Encounter Prescriptions as of 01/04/2014  Medication Sig  . albuterol (PROVENTIL HFA;VENTOLIN HFA) 108 (90 BASE) MCG/ACT inhaler Inhale 2 puffs into the lungs every 6 (six) hours as needed for wheezing.  . calcium-vitamin D (OSCAL WITH D) 250-125 MG-UNIT per tablet Take 1 tablet by mouth daily.  . cholecalciferol (VITAMIN D) 1000 UNITS tablet Take 1,000 Units by mouth daily.  . clopidogrel (PLAVIX) 75 MG tablet Take 75 mg by mouth daily.  . Ferrous Sulfate (IRON) 28 MG TABS Take 28 mg by mouth daily.  . furosemide (LASIX) 10 MG/ML solution Take 10 mg by mouth daily.  Marland Kitchen losartan (COZAAR) 50 MG tablet Take 1 tablet (50 mg total) by mouth daily.  . Melatonin 5 MG  CAPS Take 5 mg by mouth daily as needed.  . NON FORMULARY [place] takes 1 tablet  twice daily.  . Omega-3 Fatty Acids (FISH OIL) 1000 MG CAPS Take 1,000 mg by mouth daily.  Marland Kitchen rOPINIRole (REQUIP) 0.25 MG tablet Take 0.25 mg by mouth at bedtime.  . simvastatin (ZOCOR) 20 MG tablet Take 20 mg by mouth daily.  . tamsulosin (FLOMAX) 0.4 MG CAPS Take 0.4 mg by mouth daily.  . verapamil (CALAN-SR) 180 MG CR tablet Take 180 mg by mouth at bedtime.  . vitamin E 400 UNIT capsule Take 400 Units by mouth daily.    Review of Systems  Constitutional: Negative.   HENT: Negative.   Eyes: Negative.   Respiratory: Negative.   Cardiovascular: Negative.   Gastrointestinal: Negative.   Endocrine: Negative.   Musculoskeletal: Negative.   Skin: Negative.   Allergic/Immunologic: Negative.   Neurological: Negative.   Hematological: Negative.   Psychiatric/Behavioral: Negative.   All other systems reviewed and are negative.   BP 150/68  Pulse 73  Ht 5' (1.524 m)  Wt 160 lb 8 oz (72.802 kg)  BMI 31.35 kg/m2  Physical Exam  Nursing note and vitals reviewed. Constitutional: He is oriented to person, place, and time. He appears well-developed and well-nourished.  HENT:  Head: Normocephalic.  Nose: Nose normal.  Mouth/Throat: Oropharynx is clear and moist.  Eyes: Conjunctivae are normal. Pupils are equal, round, and reactive to light.  Neck: Normal range of motion. Neck supple.  No JVD present.  Cardiovascular: Normal rate, regular rhythm, S1 normal, S2 normal and intact distal pulses.  Exam reveals no gallop and no friction rub.   Murmur heard.  Crescendo systolic murmur is present with a grade of 1/6  Pulmonary/Chest: Effort normal and breath sounds normal. No respiratory distress. He has no wheezes. He has no rales. He exhibits no tenderness.  Abdominal: Soft. Bowel sounds are normal. He exhibits no distension. There is no tenderness.  Musculoskeletal: Normal range of motion. He exhibits no  edema and no tenderness.  Lymphadenopathy:    He has no cervical adenopathy.  Neurological: He is alert and oriented to person, place, and time. Coordination normal.  Skin: Skin is warm and dry. No rash noted. No erythema.  Psychiatric: He has a normal mood and affect. His behavior is normal. Judgment and thought content normal.      Assessment and Plan

## 2014-01-04 NOTE — Patient Instructions (Signed)
You are doing well. No medication changes were made.  Please call us if you have new issues that need to be addressed before your next appt.  Your physician wants you to follow-up in: 6 months.  You will receive a reminder letter in the mail two months in advance. If you don't receive a letter, please call our office to schedule the follow-up appointment.   

## 2014-01-04 NOTE — Assessment & Plan Note (Signed)
He reports recent medication changes for restless leg. Now having insomnia. Recommended he follow up with primary care

## 2014-01-04 NOTE — Assessment & Plan Note (Signed)
He reports palpitations have essentially resolved. Possibly from the verapamil. No medication changes made

## 2014-03-31 DIAGNOSIS — G603 Idiopathic progressive neuropathy: Secondary | ICD-10-CM | POA: Insufficient documentation

## 2014-04-22 IMAGING — CR DG CHEST 2V
1 series · 2 of 2 positions shown · non-contrast
Comparison: none

REASON FOR EXAM: shortness of breath
COMMENTS:

[Series 1: pa · 0.17mm/px · 2 of 2 slices shown]
[im 1/2]
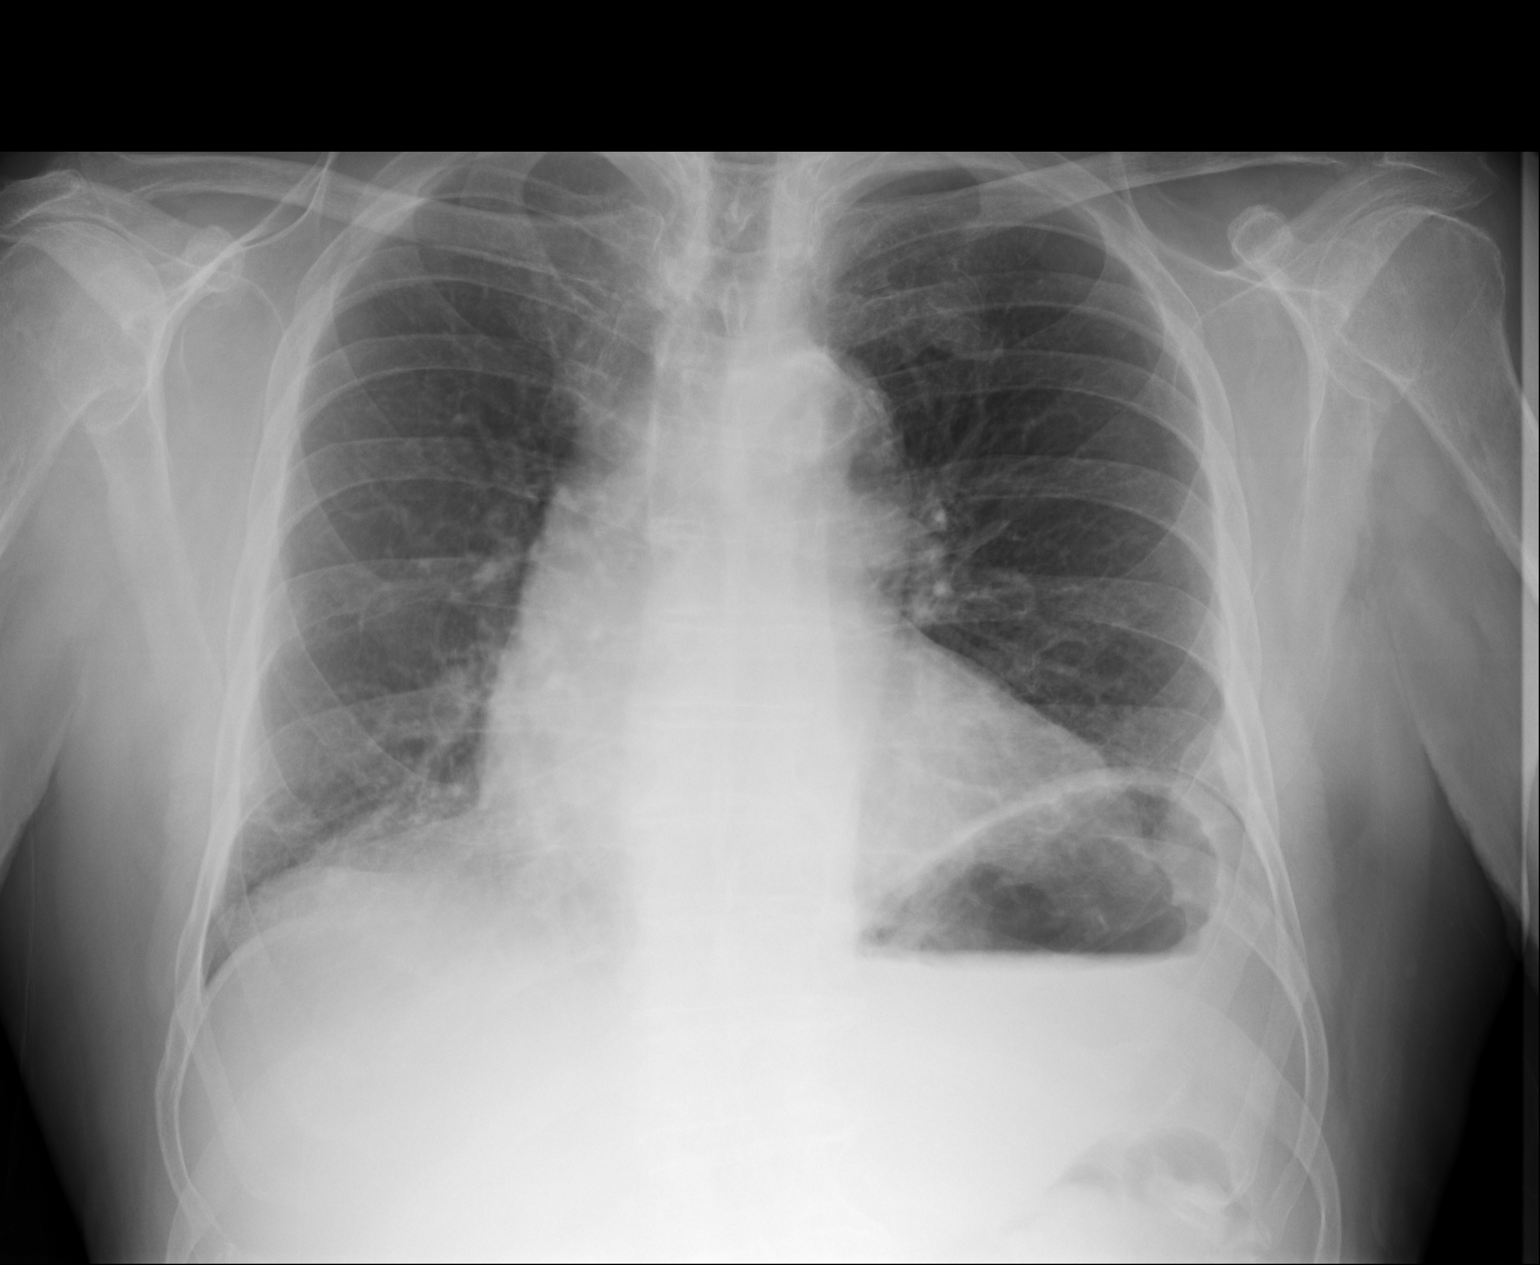
[im 2/2]
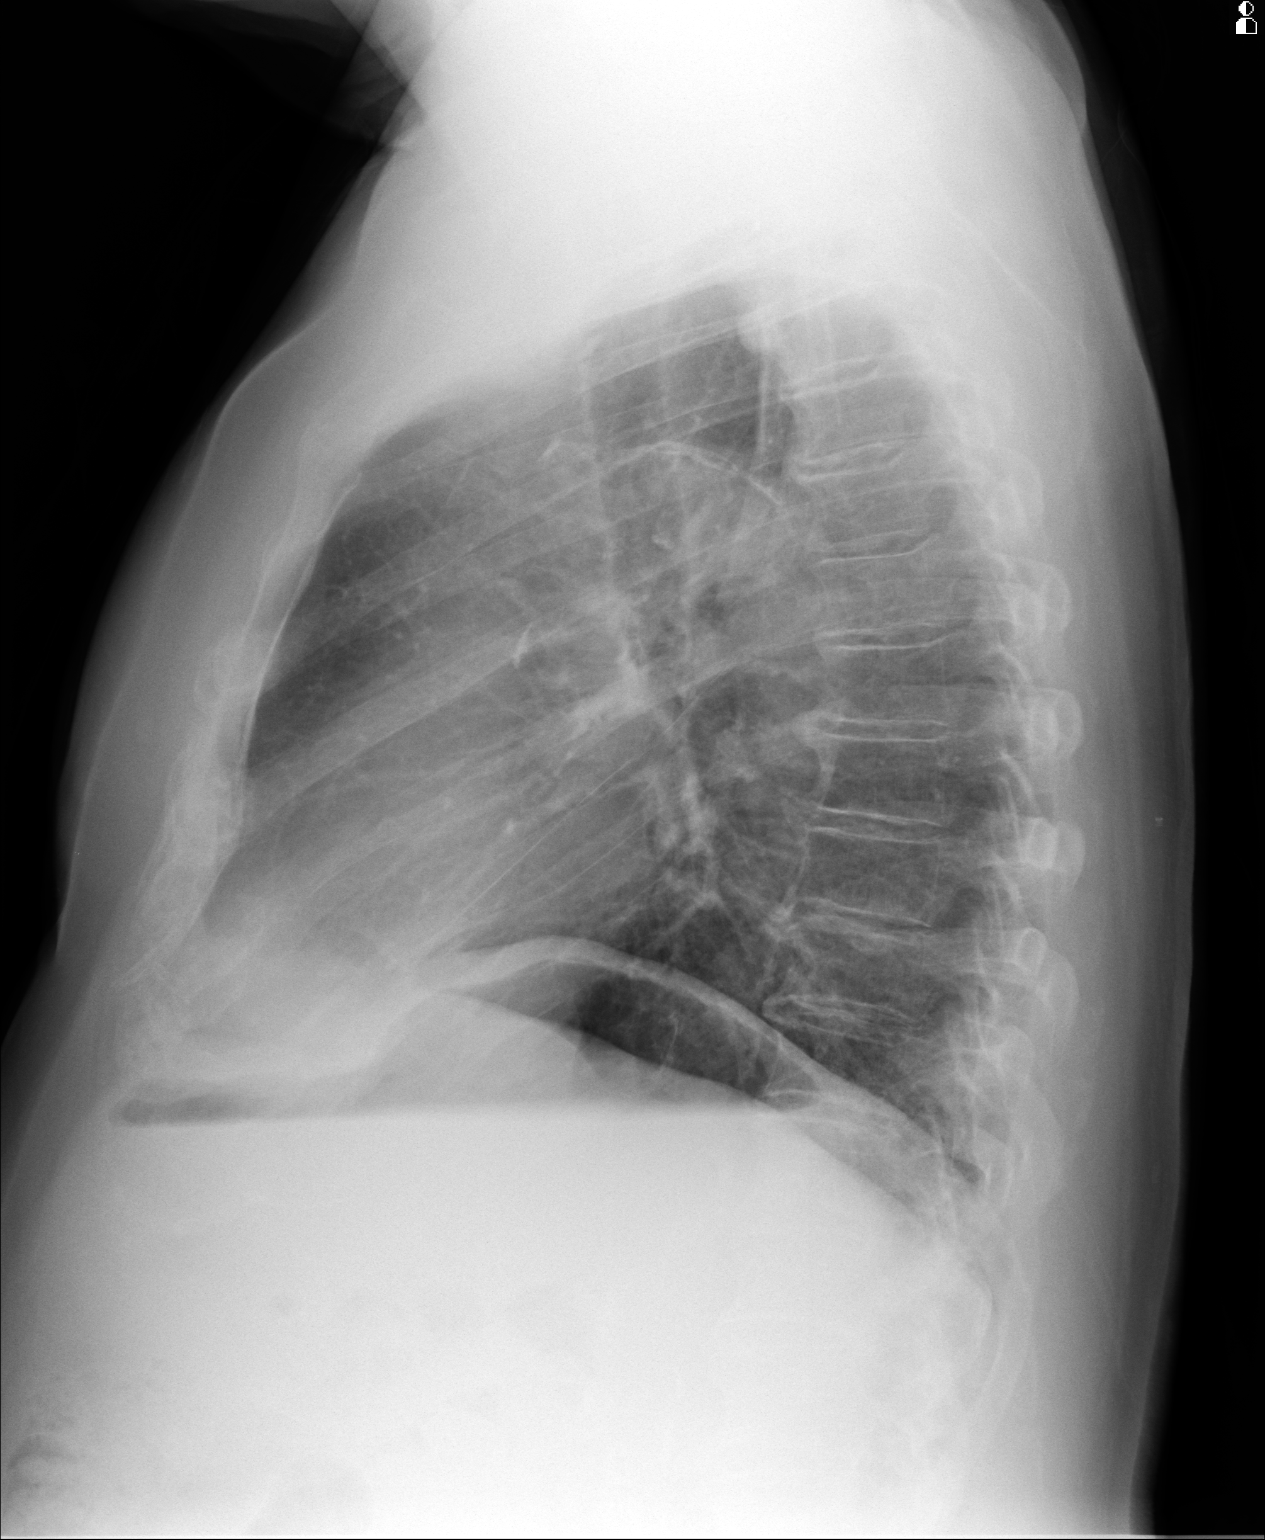

[2 of 2 positions shown; findings below may reference images not displayed]

PROCEDURE:     [facility] - DIRKIE CHEST PA (OR AP) AND LAT  - November 30, 2011  [DATE]

RESULT:     There are no prior chest radiographs for comparison.

There are a few fine linear densities at the right costophrenic angle
consistent with minimal discoid atelectasis. No pneumonia, pneumothorax or
pleural effusion is seen. The heart is mildly enlarged but no associated
pulmonary edema is seen. No acute bony abnormalities are identified.

The patient reports he has recently been told of a spot on his lung. No
definite lung mass or nodule is seen. There is an equivocal density
associated with the first left anterior rib. If this is the area in
question, additional evaluation by apical lordotic views or chest CT is
recommended.
IMPRESSION: 1.  No pneumonia or other acute change is identified.
2.  The heart appears mildly enlarged.
3.  A definite pulmonary nodule is not identified; however, an equivocal
density is noted in relation to the first, left anterior rib. If this is an
area of question from a prior outside examination, then additional
evaluation by apical lordotic views or chest CT would be recommended.

[REDACTED]

## 2014-07-22 ENCOUNTER — Ambulatory Visit (INDEPENDENT_AMBULATORY_CARE_PROVIDER_SITE_OTHER): Payer: Medicare HMO | Admitting: Cardiovascular Disease

## 2014-07-22 ENCOUNTER — Encounter: Payer: Self-pay | Admitting: Cardiovascular Disease

## 2014-07-22 VITALS — BP 142/60 | HR 80 | Ht 61.5 in | Wt 166.8 lb

## 2014-07-22 DIAGNOSIS — R609 Edema, unspecified: Secondary | ICD-10-CM

## 2014-07-22 DIAGNOSIS — R0602 Shortness of breath: Secondary | ICD-10-CM

## 2014-07-22 DIAGNOSIS — I10 Essential (primary) hypertension: Secondary | ICD-10-CM

## 2014-07-22 DIAGNOSIS — G2581 Restless legs syndrome: Secondary | ICD-10-CM

## 2014-07-22 DIAGNOSIS — R002 Palpitations: Secondary | ICD-10-CM

## 2014-07-22 MED ORDER — FUROSEMIDE 20 MG PO TABS: 20.0000 mg | ORAL_TABLET | Freq: Every day | ORAL | Status: AC | PRN

## 2014-07-22 NOTE — Assessment & Plan Note (Signed)
He continues to have problems with restless leg, insomnia. Recommended he continue on his melatonin, follow-up with Dr. Luan Pulling

## 2014-07-22 NOTE — Assessment & Plan Note (Signed)
Minimal edema on today's visit. Suggested he take Lasix only when necessary for worsening ankle swelling. Lasix changed to 20 mg by mouth as needed

## 2014-07-22 NOTE — Progress Notes (Signed)
Patient ID: Jeremiah Bates, male    DOB: [birth year], 81 y.o.   MRN: [id number]  HPI Comments: [name] is a very pleasant 81 year old gentleman, patient of Dr. Luan Pulling, with a history of hypertension, mild diastolic CHF,   previous symptoms of shortness of breath, palpitations. He presents for routine followup of his shortness of breath and CHF symptoms.  He reports doing well overall but has trouble with his hearing. Had hearing impaction, cleaned out by ENT, then hearing got worse after infection. Now having trouble hearing in the right ear, affected ear Has minimal leg swelling in the ankle on the left Balance is good, relatively active at baseline, no symptoms of chest pain or shortness of breath with exertion Not taking Lasix on a regular basis. Was previously prescribed liquid Lasix back in March 2015 by PMD, prior to that was on Lasix po. He reports that he does not drive anymore, medications, through the mail He denies having any significant palpitations  EKG on today's visit shows normal sinus rhythm with rate 80 bpm, no significant ST or T-wave changes  In general he reports feeling well. His only complaint is insomnia. He states that his medications were recently changed within the past week or so. Since that time he has not been sleeping as well. No significant sleep for the past 3 days. He is on medication for restless leg. By his account, he was previously on gabapentin which may have worked better for his sleep but perhaps not as good for his restless leg.   Previous echocardiogram was essentially normal. No significant valvular disease. Normal right ventricular systolic pressures.   No Known Allergies  Outpatient Encounter Prescriptions as of 07/22/2014  Medication Sig  . albuterol (PROVENTIL HFA;VENTOLIN HFA) 108 (90 BASE) MCG/ACT inhaler Inhale 2 puffs into the lungs every 6 (six) hours as needed for wheezing.  . calcium-vitamin D (OSCAL WITH D) 250-125 MG-UNIT per  tablet Take 1 tablet by mouth daily.  . cholecalciferol (VITAMIN D) 1000 UNITS tablet Take 1,000 Units by mouth daily.  . clopidogrel (PLAVIX) 75 MG tablet Take 75 mg by mouth daily.  . Ferrous Sulfate (IRON) 28 MG TABS Take 28 mg by mouth daily.  . furosemide (LASIX) 10 MG/ML solution Take 10 mg by mouth daily.  Marland Kitchen losartan (COZAAR) 50 MG tablet Take 1 tablet (50 mg total) by mouth daily.  . Melatonin 5 MG CAPS Take 5 mg by mouth daily as needed.  . NON FORMULARY [place] takes 1 tablet  twice daily.  . Omega-3 Fatty Acids (FISH OIL) 1000 MG CAPS Take 1,000 mg by mouth daily.  . simvastatin (ZOCOR) 20 MG tablet Take 20 mg by mouth daily.  . tamsulosin (FLOMAX) 0.4 MG CAPS Take 0.4 mg by mouth daily.  . verapamil (CALAN-SR) 180 MG CR tablet Take 180 mg by mouth at bedtime.  . vitamin E 400 UNIT capsule Take 400 Units by mouth daily.  . [DISCONTINUED] rOPINIRole (REQUIP) 0.25 MG tablet Take 0.25 mg by mouth at bedtime.    Past Medical History  Diagnosis Date  . Anemia   . Restless leg syndrome   . Seasonal allergies   . Kidney disease   . Hypertension   . Neuropathy   . Prostate cancer   . CHF (congestive heart failure)     mild  . Gastritis   . Stroke     Past Surgical History  Procedure Laterality Date  . Transurethral resection of prostate    .  Appendectomy    . Hernia repair    . Colonoscopy    . Upper gi endoscopy    . Carpal tunnel release    . Cataract extraction Bilateral     Social History  reports that he has never smoked. He does not have any smokeless tobacco history on file. He reports that he does not drink alcohol or use illicit drugs.  Family History Family history is unknown by patient.  Review of Systems  Constitutional: Negative.   HENT: Positive for hearing loss.   Respiratory: Negative.   Cardiovascular: Negative.   Gastrointestinal: Negative.   Musculoskeletal: Negative.   Skin: Negative.   Allergic/Immunologic: Negative.    Neurological: Negative.   Hematological: Negative.   Psychiatric/Behavioral: Negative.   All other systems reviewed and are negative.   BP 142/60 mmHg  Pulse 80  Ht 5' 1.5" (1.562 m)  Wt 166 lb 12 oz (75.637 kg)  BMI 31.00 kg/m2  Physical Exam  Constitutional: He is oriented to person, place, and time. He appears well-developed and well-nourished.  HENT:  Head: Normocephalic.  Nose: Nose normal.  Mouth/Throat: Oropharynx is clear and moist.  Eyes: Conjunctivae are normal. Pupils are equal, round, and reactive to light.  Neck: Normal range of motion. Neck supple. No JVD present.  Cardiovascular: Normal rate, regular rhythm, S1 normal, S2 normal and intact distal pulses.  Exam reveals no gallop and no friction rub.   Murmur heard.  Crescendo systolic murmur is present with a grade of 1/6  Pulmonary/Chest: Effort normal and breath sounds normal. No respiratory distress. He has no wheezes. He has no rales. He exhibits no tenderness.  Abdominal: Soft. Bowel sounds are normal. He exhibits no distension. There is no tenderness.  Musculoskeletal: Normal range of motion. He exhibits no edema or tenderness.  Lymphadenopathy:    He has no cervical adenopathy.  Neurological: He is alert and oriented to person, place, and time. Coordination normal.  Skin: Skin is warm and dry. No rash noted. No erythema.  Psychiatric: He has a normal mood and affect. His behavior is normal. Judgment and thought content normal.      Assessment and Plan   Nursing note and vitals reviewed.

## 2014-07-22 NOTE — Assessment & Plan Note (Signed)
Blood pressure is well controlled on today's visit. No changes made to the medications. 

## 2014-07-22 NOTE — Patient Instructions (Addendum)
You are doing well. No medication changes were made.  Please take lasix pill as needed for worsening leg swelling  Please call us if you have new issues that need to be addressed before your next appt.  Your physician wants you to follow-up in: 12 months.  You will receive a reminder letter in the mail two months in advance. If you don't receive a letter, please call our office to schedule the follow-up appointment.

## 2014-10-12 ENCOUNTER — Ambulatory Visit
Admit: 2014-10-12 | Disposition: A | Discharge status: Home / Self Care | Payer: Self-pay | Attending: Family Medicine | Admitting: Family Medicine

## 2014-10-19 NOTE — Op Note (Signed)
PATIENT NAME:  Jeremiah Bates, Jeremiah Bates MR#:  [id number] DATE OF BIRTH:  07-30-1919  DATE OF PROCEDURE:  04/22/2012  PREOPERATIVE DIAGNOSIS: Visually significant cataract of the left eye.   POSTOPERATIVE DIAGNOSIS: Visually significant cataract of the left eye.   OPERATIVE PROCEDURE: Cataract extraction by phacoemulsification with implant of intraocular lens to the left eye.   SURGEON: Birder Robson, MD  ANESTHESIA:  1. Managed anesthesia care.  2. 50-50 mixture of 0.75% bupivacaine and 4% Xylocaine given as a retrobulbar block.   COMPLICATIONS: None.   TECHNIQUE:  Stop-and-chop    DESCRIPTION OF PROCEDURE: The patient was examined and consented for this procedure in the preoperative holding area and then brought back to the Operating Room where the anesthesia team employed managed anesthesia care.  3.5 milliliters of the aforementioned mixture were placed in the left orbit on an Atkinson needle without complication. The left eye was then prepped and draped in the usual sterile ophthalmic fashion. A lid speculum was placed. The side-port blade was used to create a paracentesis and the anterior chamber was filled with viscoelastic. The keratome was used to create a near clear corneal incision. The continuous curvilinear capsulorrhexis was performed with a cystotome followed by the capsulorrhexis forceps. Hydrodissection and hydrodelineation were carried out with BSS on a blunt cannula. The lens was removed in a stop-and-chop technique. The remaining cortical material was removed with the irrigation-aspiration handpiece. The capsular bag was inflated with viscoelastic and the Tecnis ZCB00 21.0-diopter lens, serial number [id number] was placed in the capsular bag without complication. The remaining viscoelastic was removed from the eye with the irrigation-aspiration handpiece. The wounds were hydrated. The anterior chamber was flushed with Miostat. The eye was inflated to a physiologic pressure and the  wounds were found to be water tight.   The eye was dressed with Vigamox followed by Maxitrol ointment and a protective shield was placed. The patient will follow-up with me in one day.   ____________________________ Livingston Diones. Adriella Essex, MD wlp:drc D: 04/22/2012 18:31:02 ET T: 04/23/2012 07:27:50 ET JOB#: [id number]  cc: Lubna Stegeman L. Rowen Wilmer, MD, <Dictator> Livingston Diones Delanie Tirrell MD ELECTRONICALLY SIGNED 04/23/2012 15:17

## 2014-10-19 NOTE — Op Note (Signed)
PATIENT NAME:  Jeremiah Bates, MCFARLANE MR#:  [id number] DATE OF BIRTH:  January 10, 1920  DATE OF PROCEDURE:  01/15/2012  PREOPERATIVE DIAGNOSIS: Visually significant cataract of the right eye.   POSTOPERATIVE DIAGNOSIS: Visually significant cataract of the right eye.   OPERATIVE PROCEDURE: Cataract extraction by phacoemulsification with implant of intraocular lens to the right eye.   SURGEON: Birder Robson, MD  ANESTHESIA:  1. Managed anesthesia care.  2. 50-50 mixture of 0.75% bupivacaine and 4% Xylocaine given as a retrobulbar block.   COMPLICATIONS: None.   TECHNIQUE:  Stop and chop.   DESCRIPTION OF PROCEDURE: The patient was examined and consented for this procedure in the preoperative holding area and then brought back to the Operating Room where the anesthesia team employed managed anesthesia care.  3.5 milliliters of the aforementioned mixture were placed in the right orbit on an Atkinson needle without complication. The right eye was then prepped and draped in the usual sterile ophthalmic fashion. A lid speculum was placed. The side-port blade was used to create a paracentesis and the anterior chamber was filled with viscoelastic. The keratome was used to create a near clear corneal incision. The continuous curvilinear capsulorrhexis was performed with a cystotome followed by the capsulorrhexis forceps. Hydrodissection and hydrodelineation were carried out with BSS on a blunt cannula. The lens was removed in a stop and chop technique. The remaining cortical material was removed with the irrigation-aspiration handpiece. The capsular bag was inflated with viscoelastic and the Technis ZCB00 21.5-diopter lens, serial number [id number] was placed in the capsular bag without complication. The remaining viscoelastic was removed from the eye with the irrigation-aspiration handpiece. The wounds were hydrated. The anterior chamber was flushed with Miostat. The eye was inflated to a physiologic pressure and  the wounds were found to be water tight. The eye was dressed with Vigamox followed by Maxitrol ointment and a protective shield was placed.   The patient will followup with me in one day.   ____________________________ Livingston Diones. Margarete Horace, MD wlp:cms D: 01/15/2012 12:28:26 ET T: 01/15/2012 12:53:32 ET JOB#: [id number]  cc: Cielo Arias L. Eloise Picone, MD, <Dictator>  Livingston Diones Kadeja Granada MD ELECTRONICALLY SIGNED 01/24/2012 13:18

## 2015-01-31 DIAGNOSIS — S72001A Fracture of unspecified part of neck of right femur, initial encounter for closed fracture: Secondary | ICD-10-CM

## 2015-01-31 HISTORY — DX: Fracture of unspecified part of neck of right femur, initial encounter for closed fracture: S72.001A

## 2015-02-09 ENCOUNTER — Other Ambulatory Visit: Payer: Self-pay

## 2015-02-09 ENCOUNTER — Inpatient Hospital Stay
Admission: EM | Admit: 2015-02-09 | Discharge: 2015-02-15 | DRG: 481 | Disposition: A | Discharge status: Skilled Nursing Facility | Payer: Medicare HMO | Attending: Internal Medicine | Admitting: Internal Medicine

## 2015-02-09 ENCOUNTER — Emergency Department: Payer: Medicare HMO

## 2015-02-09 ENCOUNTER — Encounter: Payer: Self-pay | Admitting: *Deleted

## 2015-02-09 DIAGNOSIS — D649 Anemia, unspecified: Secondary | ICD-10-CM | POA: Diagnosis present

## 2015-02-09 DIAGNOSIS — G2581 Restless legs syndrome: Secondary | ICD-10-CM | POA: Diagnosis present

## 2015-02-09 DIAGNOSIS — S7291XA Unspecified fracture of right femur, initial encounter for closed fracture: Secondary | ICD-10-CM

## 2015-02-09 DIAGNOSIS — Z7902 Long term (current) use of antithrombotics/antiplatelets: Secondary | ICD-10-CM

## 2015-02-09 DIAGNOSIS — E871 Hypo-osmolality and hyponatremia: Secondary | ICD-10-CM | POA: Diagnosis present

## 2015-02-09 DIAGNOSIS — N189 Chronic kidney disease, unspecified: Secondary | ICD-10-CM

## 2015-02-09 DIAGNOSIS — I129 Hypertensive chronic kidney disease with stage 1 through stage 4 chronic kidney disease, or unspecified chronic kidney disease: Secondary | ICD-10-CM | POA: Diagnosis present

## 2015-02-09 DIAGNOSIS — Z8546 Personal history of malignant neoplasm of prostate: Secondary | ICD-10-CM

## 2015-02-09 DIAGNOSIS — Y92003 Bedroom of unspecified non-institutional (private) residence as the place of occurrence of the external cause: Secondary | ICD-10-CM | POA: Diagnosis not present

## 2015-02-09 DIAGNOSIS — K5900 Constipation, unspecified: Secondary | ICD-10-CM | POA: Diagnosis present

## 2015-02-09 DIAGNOSIS — Z66 Do not resuscitate: Secondary | ICD-10-CM | POA: Diagnosis present

## 2015-02-09 DIAGNOSIS — N183 Chronic kidney disease, stage 3 (moderate): Secondary | ICD-10-CM | POA: Diagnosis present

## 2015-02-09 DIAGNOSIS — M80051A Age-related osteoporosis with current pathological fracture, right femur, initial encounter for fracture: Principal | ICD-10-CM | POA: Diagnosis present

## 2015-02-09 DIAGNOSIS — E039 Hypothyroidism, unspecified: Secondary | ICD-10-CM | POA: Diagnosis present

## 2015-02-09 DIAGNOSIS — S72009A Fracture of unspecified part of neck of unspecified femur, initial encounter for closed fracture: Secondary | ICD-10-CM

## 2015-02-09 DIAGNOSIS — Z8673 Personal history of transient ischemic attack (TIA), and cerebral infarction without residual deficits: Secondary | ICD-10-CM | POA: Diagnosis not present

## 2015-02-09 DIAGNOSIS — W1839XA Other fall on same level, initial encounter: Secondary | ICD-10-CM | POA: Diagnosis present

## 2015-02-09 DIAGNOSIS — I5032 Chronic diastolic (congestive) heart failure: Secondary | ICD-10-CM | POA: Diagnosis present

## 2015-02-09 DIAGNOSIS — Z7189 Other specified counseling: Secondary | ICD-10-CM

## 2015-02-09 DIAGNOSIS — Z01810 Encounter for preprocedural cardiovascular examination: Secondary | ICD-10-CM

## 2015-02-09 DIAGNOSIS — S7290XA Unspecified fracture of unspecified femur, initial encounter for closed fracture: Secondary | ICD-10-CM

## 2015-02-09 DIAGNOSIS — E559 Vitamin D deficiency, unspecified: Secondary | ICD-10-CM

## 2015-02-09 HISTORY — DX: Chronic diastolic (congestive) heart failure: I50.32

## 2015-02-09 HISTORY — DX: Chronic kidney disease, stage 3 (moderate): N18.3

## 2015-02-09 HISTORY — DX: Chronic kidney disease, stage 3 unspecified: N18.30

## 2015-02-09 HISTORY — DX: Hypothyroidism, unspecified: E03.9

## 2015-02-09 HISTORY — DX: Fracture of unspecified part of neck of right femur, initial encounter for closed fracture: S72.001A

## 2015-02-09 LAB — COMPREHENSIVE METABOLIC PANEL
ALT: 15 U/L — ABNORMAL LOW (ref 17–63)
ANION GAP: 8 (ref 5–15)
AST: 28 U/L (ref 15–41)
Albumin: 3.6 g/dL (ref 3.5–5.0)
Alkaline Phosphatase: 58 U/L (ref 38–126)
BILIRUBIN TOTAL: 0.4 mg/dL (ref 0.3–1.2)
BUN: 26 mg/dL — AB (ref 6–20)
CALCIUM: 8.8 mg/dL — AB (ref 8.9–10.3)
CO2: 27 mmol/L (ref 22–32)
CREATININE: 1.55 mg/dL — AB (ref 0.61–1.24)
Chloride: 96 mmol/L — ABNORMAL LOW (ref 101–111)
GFR calc Af Amer: 43 mL/min — ABNORMAL LOW (ref >60)
GFR calc non Af Amer: 37 mL/min — ABNORMAL LOW (ref >60)
Glucose, Bld: 115 mg/dL — ABNORMAL HIGH (ref 65–99)
Potassium: 4.2 mmol/L (ref 3.5–5.1)
Sodium: 131 mmol/L — ABNORMAL LOW (ref 135–145)
Total Protein: 7.1 g/dL (ref 6.5–8.1)

## 2015-02-09 LAB — TYPE AND SCREEN
ABO/RH(D): O POS
Antibody Screen: NEGATIVE

## 2015-02-09 LAB — CBC WITH DIFFERENTIAL/PLATELET
Basophils Absolute: 0 10*3/uL (ref 0–0.1)
Basophils Relative: 0 %
EOS PCT: 0 %
Eosinophils Absolute: 0 10*3/uL (ref 0–0.7)
HCT: 29.9 % — ABNORMAL LOW (ref 40.0–52.0)
Hemoglobin: 10.2 g/dL — ABNORMAL LOW (ref 13.0–18.0)
LYMPHS ABS: 0.9 10*3/uL — AB (ref 1.0–3.6)
LYMPHS PCT: 10 %
MCH: 31.9 pg (ref 26.0–34.0)
MCHC: 33.9 g/dL (ref 32.0–36.0)
MCV: 94 fL (ref 80.0–100.0)
Monocytes Absolute: 0.6 10*3/uL (ref 0.2–1.0)
Monocytes Relative: 7 %
NEUTROS PCT: 83 %
Neutro Abs: 7.2 10*3/uL — ABNORMAL HIGH (ref 1.4–6.5)
Platelets: 304 10*3/uL (ref 150–440)
RBC: 3.18 MIL/uL — ABNORMAL LOW (ref 4.40–5.90)
RDW: 14.2 % (ref 11.5–14.5)
WBC: 8.6 10*3/uL (ref 3.8–10.6)

## 2015-02-09 LAB — URINALYSIS COMPLETE WITH MICROSCOPIC (ARMC ONLY)
BACTERIA UA: NONE SEEN
Bilirubin Urine: NEGATIVE
GLUCOSE, UA: NEGATIVE mg/dL
Hgb urine dipstick: NEGATIVE
KETONES UR: NEGATIVE mg/dL
Leukocytes, UA: NEGATIVE
Nitrite: NEGATIVE
Protein, ur: 100 mg/dL — AB
SPECIFIC GRAVITY, URINE: 1.01 (ref 1.005–1.030)
Squamous Epithelial / LPF: NONE SEEN
pH: 7 (ref 5.0–8.0)

## 2015-02-09 LAB — CBC
HCT: 33.8 % — ABNORMAL LOW (ref 40.0–52.0)
Hemoglobin: 11.2 g/dL — ABNORMAL LOW (ref 13.0–18.0)
MCH: 31.4 pg (ref 26.0–34.0)
MCHC: 33.1 g/dL (ref 32.0–36.0)
MCV: 94.9 fL (ref 80.0–100.0)
Platelets: 342 10*3/uL (ref 150–440)
RBC: 3.56 MIL/uL — ABNORMAL LOW (ref 4.40–5.90)
RDW: 14.3 % (ref 11.5–14.5)
WBC: 9.4 10*3/uL (ref 3.8–10.6)

## 2015-02-09 LAB — PROTIME-INR
INR: 1.06
PROTHROMBIN TIME: 14 s (ref 11.4–15.0)

## 2015-02-09 LAB — TROPONIN I

## 2015-02-09 LAB — CREATININE, SERUM
CREATININE: 1.57 mg/dL — AB (ref 0.61–1.24)
GFR calc Af Amer: 42 mL/min — ABNORMAL LOW (ref >60)
GFR calc non Af Amer: 36 mL/min — ABNORMAL LOW (ref >60)

## 2015-02-09 LAB — CK: Total CK: 213 U/L (ref 49–397)

## 2015-02-09 LAB — APTT: APTT: 30 s (ref 24–36)

## 2015-02-09 LAB — SURGICAL PCR SCREEN
MRSA, PCR: NEGATIVE
STAPHYLOCOCCUS AUREUS: NEGATIVE

## 2015-02-09 LAB — ABO/RH: ABO/RH(D): O POS

## 2015-02-09 MED ORDER — CALCIUM CARBONATE-VITAMIN D 500-200 MG-UNIT PO TABS
1.0000 | ORAL_TABLET | Freq: Every day | ORAL | Status: DC
  Administered 2015-02-11: 1 via ORAL
  Filled 2015-02-09: qty 1

## 2015-02-09 MED ORDER — MORPHINE SULFATE 2 MG/ML IJ SOLN: 0.5000 mg | INTRAMUSCULAR | Status: DC | PRN

## 2015-02-09 MED ORDER — FERROUS SULFATE 325 (65 FE) MG PO TABS
325.0000 mg | ORAL_TABLET | Freq: Three times a day (TID) | ORAL | Status: DC
  Administered 2015-02-10 – 2015-02-15 (×14): 325 mg via ORAL
  Filled 2015-02-09 (×14): qty 1

## 2015-02-09 MED ORDER — VITAMIN E 180 MG (400 UNIT) PO CAPS
400.0000 [IU] | ORAL_CAPSULE | Freq: Every day | ORAL | Status: DC
  Administered 2015-02-11 – 2015-02-15 (×5): 400 [IU] via ORAL
  Filled 2015-02-09 (×5): qty 1

## 2015-02-09 MED ORDER — SODIUM CHLORIDE 0.9 % IV SOLN
INTRAVENOUS | Status: DC
  Administered 2015-02-09 – 2015-02-10 (×2): via INTRAVENOUS

## 2015-02-09 MED ORDER — CEFAZOLIN SODIUM 1-5 GM-% IV SOLN
1.0000 g | Freq: Once | INTRAVENOUS | Status: DC
  Filled 2015-02-09: qty 50

## 2015-02-09 MED ORDER — SODIUM CHLORIDE 0.9 % IJ SOLN
3.0000 mL | Freq: Two times a day (BID) | INTRAMUSCULAR | Status: DC
  Administered 2015-02-09 – 2015-02-14 (×7): 3 mL via INTRAVENOUS

## 2015-02-09 MED ORDER — PRAMIPEXOLE DIHYDROCHLORIDE 1 MG PO TABS
2.0000 mg | ORAL_TABLET | Freq: Every day | ORAL | Status: DC
  Administered 2015-02-09 – 2015-02-14 (×6): 2 mg via ORAL
  Filled 2015-02-09 (×7): qty 2

## 2015-02-09 MED ORDER — ACETAMINOPHEN 325 MG PO TABS
650.0000 mg | ORAL_TABLET | Freq: Four times a day (QID) | ORAL | Status: DC | PRN
  Filled 2015-02-09: qty 2

## 2015-02-09 MED ORDER — SIMVASTATIN 20 MG PO TABS
20.0000 mg | ORAL_TABLET | Freq: Every day | ORAL | Status: DC
  Administered 2015-02-09 – 2015-02-14 (×6): 20 mg via ORAL
  Filled 2015-02-09 (×6): qty 1

## 2015-02-09 MED ORDER — TRAZODONE HCL 100 MG PO TABS
100.0000 mg | ORAL_TABLET | Freq: Every day | ORAL | Status: DC
  Administered 2015-02-09 – 2015-02-14 (×6): 100 mg via ORAL
  Filled 2015-02-09 (×6): qty 1

## 2015-02-09 MED ORDER — PANTOPRAZOLE SODIUM 40 MG PO TBEC
40.0000 mg | DELAYED_RELEASE_TABLET | Freq: Every day | ORAL | Status: DC
  Administered 2015-02-11 – 2015-02-15 (×5): 40 mg via ORAL
  Filled 2015-02-09 (×5): qty 1

## 2015-02-09 MED ORDER — HYDROCODONE-ACETAMINOPHEN 5-325 MG PO TABS
1.0000 | ORAL_TABLET | ORAL | Status: DC | PRN
  Administered 2015-02-09 – 2015-02-12 (×6): 1 via ORAL
  Filled 2015-02-09 (×7): qty 1

## 2015-02-09 MED ORDER — SENNA 8.6 MG PO TABS
1.0000 | ORAL_TABLET | Freq: Two times a day (BID) | ORAL | Status: DC
  Administered 2015-02-10 – 2015-02-15 (×8): 8.6 mg via ORAL
  Filled 2015-02-09 (×12): qty 1

## 2015-02-09 MED ORDER — METHOCARBAMOL 500 MG PO TABS
500.0000 mg | ORAL_TABLET | Freq: Four times a day (QID) | ORAL | Status: DC | PRN
  Administered 2015-02-14 – 2015-02-15 (×3): 500 mg via ORAL
  Filled 2015-02-09 (×3): qty 1

## 2015-02-09 MED ORDER — SENNOSIDES-DOCUSATE SODIUM 8.6-50 MG PO TABS
1.0000 | ORAL_TABLET | Freq: Every evening | ORAL | Status: DC | PRN
  Administered 2015-02-11: 1 via ORAL
  Filled 2015-02-09 (×2): qty 1

## 2015-02-09 MED ORDER — ALUM & MAG HYDROXIDE-SIMETH 200-200-20 MG/5ML PO SUSP
30.0000 mL | Freq: Four times a day (QID) | ORAL | Status: DC | PRN
  Administered 2015-02-11: 30 mL via ORAL
  Filled 2015-02-09: qty 30

## 2015-02-09 MED ORDER — ONDANSETRON HCL 4 MG/2ML IJ SOLN: 4.0000 mg | Freq: Four times a day (QID) | INTRAMUSCULAR | Status: DC | PRN

## 2015-02-09 MED ORDER — METHOCARBAMOL 1000 MG/10ML IJ SOLN: 500.0000 mg | Freq: Four times a day (QID) | INTRAVENOUS | Status: DC | PRN

## 2015-02-09 MED ORDER — LEVOTHYROXINE SODIUM 25 MCG PO TABS
25.0000 ug | ORAL_TABLET | Freq: Every day | ORAL | Status: DC
  Administered 2015-02-11 – 2015-02-15 (×5): 25 ug via ORAL
  Filled 2015-02-09 (×5): qty 1

## 2015-02-09 MED ORDER — ONDANSETRON HCL 4 MG PO TABS: 4.0000 mg | ORAL_TABLET | Freq: Four times a day (QID) | ORAL | Status: DC | PRN

## 2015-02-09 MED ORDER — LOSARTAN POTASSIUM 50 MG PO TABS
50.0000 mg | ORAL_TABLET | Freq: Every day | ORAL | Status: DC
  Administered 2015-02-09 – 2015-02-15 (×6): 50 mg via ORAL
  Filled 2015-02-09 (×6): qty 1

## 2015-02-09 MED ORDER — ACETAMINOPHEN 650 MG RE SUPP: 650.0000 mg | Freq: Four times a day (QID) | RECTAL | Status: DC | PRN

## 2015-02-09 MED ORDER — ZOLPIDEM TARTRATE 5 MG PO TABS: 5.0000 mg | ORAL_TABLET | Freq: Every evening | ORAL | Status: DC | PRN

## 2015-02-09 MED ORDER — HYDROCODONE-ACETAMINOPHEN 5-325 MG PO TABS: 1.0000 | ORAL_TABLET | Freq: Four times a day (QID) | ORAL | Status: DC | PRN

## 2015-02-09 MED ORDER — CLONAZEPAM 0.5 MG PO TABS
0.5000 mg | ORAL_TABLET | Freq: Every day | ORAL | Status: DC
  Administered 2015-02-09 – 2015-02-14 (×6): 0.5 mg via ORAL
  Filled 2015-02-09 (×6): qty 1

## 2015-02-09 MED ORDER — HYDROMORPHONE HCL 1 MG/ML IJ SOLN: 1.0000 mg | INTRAMUSCULAR | Status: DC | PRN

## 2015-02-09 MED ORDER — ALBUTEROL SULFATE (2.5 MG/3ML) 0.083% IN NEBU: 2.5000 mg | INHALATION_SOLUTION | Freq: Four times a day (QID) | RESPIRATORY_TRACT | Status: DC | PRN

## 2015-02-09 MED ORDER — LORATADINE 10 MG PO TABS
10.0000 mg | ORAL_TABLET | Freq: Every day | ORAL | Status: DC
  Administered 2015-02-09 – 2015-02-14 (×6): 10 mg via ORAL
  Filled 2015-02-09 (×6): qty 1

## 2015-02-09 MED ORDER — TAMSULOSIN HCL 0.4 MG PO CAPS
0.4000 mg | ORAL_CAPSULE | Freq: Every day | ORAL | Status: DC
  Administered 2015-02-09 – 2015-02-15 (×6): 0.4 mg via ORAL
  Filled 2015-02-09 (×6): qty 1

## 2015-02-09 MED ORDER — HEPARIN SODIUM (PORCINE) 5000 UNIT/ML IJ SOLN
5000.0000 [IU] | Freq: Three times a day (TID) | INTRAMUSCULAR | Status: DC
  Administered 2015-02-09 – 2015-02-15 (×14): 5000 [IU] via SUBCUTANEOUS
  Filled 2015-02-09 (×15): qty 1

## 2015-02-09 MED ORDER — VERAPAMIL HCL ER 180 MG PO TBCR
180.0000 mg | EXTENDED_RELEASE_TABLET | Freq: Every day | ORAL | Status: DC
  Administered 2015-02-09 – 2015-02-15 (×6): 180 mg via ORAL
  Filled 2015-02-09 (×6): qty 1

## 2015-02-09 NOTE — ED Provider Notes (Signed)
[facility] Emergency Department Provider Note   ____________________________________________  Time seen: 9:40 AM I have reviewed the triage vital signs and the triage nursing note.  HISTORY  Chief Complaint Fall   Historian Patient  HPI Jeremiah Bates is a 81 y.o. male who states he was having spasms in his right hip that when he tried to get up fell down to his knees and then was unable to get off the floor because his right hip was hurting. He calls it a spasm. Symptoms are moderate. Wife or caregiver apparently found him on the ground after several hours. Patient thinks fall me it happened around 3:30 AM. No recent illness. No chest pain, abdominal pain, head or neck pain or injury.    Past Medical History  Diagnosis Date  . Anemia   . Restless leg syndrome   . Seasonal allergies   . Kidney disease   . Hypertension   . Neuropathy   . Prostate cancer   . CHF (congestive heart failure)     mild  . Gastritis   . Stroke     Patient Active Problem List   Diagnosis Date Noted  . Insomnia 01/04/2014  . Restless leg 01/04/2014  . Palpitations 09/24/2012  . SOB (shortness of breath) 09/24/2012  . Edema 09/24/2012  . Essential hypertension 09/24/2012    Past Surgical History  Procedure Laterality Date  . Transurethral resection of prostate    . Appendectomy    . Hernia repair    . Colonoscopy    . Upper gi endoscopy    . Carpal tunnel release    . Cataract extraction Bilateral     Current Outpatient Rx  Name  Route  Sig  Dispense  Refill  . albuterol (PROVENTIL HFA;VENTOLIN HFA) 108 (90 BASE) MCG/ACT inhaler   Inhalation   Inhale 2 puffs into the lungs every 6 (six) hours as needed for wheezing.         . Calcium Carb-Cholecalciferol (CALCIUM 500 +D) 500-400 MG-UNIT TABS   Oral   Take 1 tablet by mouth daily.         . clonazePAM (KLONOPIN) 0.5 MG tablet   Oral   Take 0.5 mg by mouth at bedtime.         . clopidogrel  (PLAVIX) 75 MG tablet   Oral   Take 75 mg by mouth daily.         . ferrous sulfate 325 (65 FE) MG tablet   Oral   Take 325 mg by mouth 3 (three) times daily with meals.         . furosemide (LASIX) 20 MG tablet   Oral   Take 1 tablet (20 mg total) by mouth daily as needed. Patient taking differently: Take 10 mg by mouth daily as needed for fluid or edema.    30 tablet   6   . levothyroxine (SYNTHROID, LEVOTHROID) 25 MCG tablet   Oral   Take 25 mcg by mouth daily before breakfast.         . loratadine (CLARITIN) 10 MG tablet   Oral   Take 10 mg by mouth at bedtime.         Marland Kitchen losartan (COZAAR) 50 MG tablet   Oral   Take 1 tablet (50 mg total) by mouth daily. Patient taking differently: Take 100 mg by mouth daily.    30 tablet   3   . Omega-3 Fatty Acids (FISH OIL) 1200 MG CAPS  Oral   Take 1 capsule by mouth daily.         . pantoprazole (PROTONIX) 20 MG tablet   Oral   Take 20 mg by mouth daily.         . pramipexole (MIRAPEX) 1 MG tablet   Oral   Take 2 mg by mouth at bedtime.         . Probiotic Product (PROBIOTIC PO)   Oral   Take 1 tablet by mouth daily.         . simvastatin (ZOCOR) 20 MG tablet   Oral   Take 20 mg by mouth daily at 6 PM.          . tamsulosin (FLOMAX) 0.4 MG CAPS   Oral   Take 0.4 mg by mouth daily.         . traZODone (DESYREL) 100 MG tablet   Oral   Take 100 mg by mouth at bedtime.         . verapamil (CALAN-SR) 180 MG CR tablet   Oral   Take 180 mg by mouth daily.          . vitamin E 400 UNIT capsule   Oral   Take 400 Units by mouth daily.           Allergies Review of patient's allergies indicates no known allergies.  Family History  Problem Relation Age of Onset  . Family history unknown: Yes    Social History Social History  Substance Use Topics  . Smoking status: Never Smoker   . Smokeless tobacco: None  . Alcohol Use: No    Review of Systems  Constitutional: Negative for  fever. Eyes: Negative for visual changes. ENT: Negative for sore throat. Cardiovascular: Negative for chest pain. Respiratory: Negative for shortness of breath. Gastrointestinal: Negative for abdominal pain, vomiting and diarrhea. Genitourinary: Negative for dysuria. Musculoskeletal: Negative for back pain. Skin: Negative for rash. Neurological: Negative for headaches, focal weakness or numbness. 10 point Review of Systems otherwise negative ____________________________________________   PHYSICAL EXAM:  VITAL SIGNS: ED Triage Vitals  Enc Vitals Group     BP --      Pulse --      Resp --      Temp --      Temp src --      SpO2 --      Weight --      Height --      Head Cir --      Peak Flow --      Pain Score --      Pain Loc --      Pain Edu? --      Excl. in [place]? --      Constitutional: Alert and oriented. Well appearing and in no distress. Eyes: Conjunctivae are normal. PERRL. Normal extraocular movements. ENT   Head: Normocephalic and atraumatic.   Nose: No congestion/rhinnorhea.   Mouth/Throat: Mucous membranes are moist.   Neck: No stridor. No posterior midline C-spine tenderness. Cardiovascular/Chest: Normal rate, regular rhythm.  No murmurs, rubs, or gallops. Respiratory: Normal respiratory effort without tachypnea nor retractions. Breath sounds are clear and equal bilaterally. No wheezes/rales/rhonchi. Gastrointestinal: Soft. No distention, no guarding, no rebound. Nontender   Genitourinary/rectal:Deferred Musculoskeletal: Tenderness passive and active range of motion of the right hip. Pelvis stable. Other extremities without any evidence of trauma. Right lower extremity without any shortening or external rotation. Neurovascularly intact for extremity. Neurologic: Hard of hearing. Normal speech and  language. No gross or focal neurologic deficits are appreciated. Skin:  Skin is warm, dry and intact. No rash noted. Psychiatric: Mood and affect are  normal. Speech and behavior are normal. Patient exhibits appropriate insight and judgment.  ____________________________________________   EKG I, Lisa Roca, MD, the attending physician have personally viewed and interpreted all ECGs.  96 bpm. Sinus rhythm with first-degree AV block. Narrow QRS. Normal axis. Normal ST and T-wave. ____________________________________________  LABS (pertinent positives/negatives)  Urinalysis negative Sodium 131, chloride 96, BUN 26 and creatinine 1.55 Troponin less than 0.03 White blood count 8.6, hemoglobin 10.2 CK 213  ____________________________________________  RADIOLOGY All Xrays were viewed by me. Imaging interpreted by Radiologist.  Right hip and pelvis: Right subcapital hip fracture Chest x-ray portable: Negative __________________________________________  PROCEDURES  Procedure(s) performed: None Critical Care performed: None  ____________________________________________   ED COURSE / ASSESSMENT AND PLAN  CONSULTATIONS: Phone consultation with Dr. Sabra Heck, orthopedics. Face-to-face with hospitalist for admission.  Pertinent labs & imaging results that were available during my care of the patient were reviewed by me and considered in my medical decision making (see chart for details).  At 10:15 I was able speak with the stepdaughter who indicated that he has been complaining of some pain in the hip, trouble sleeping, and has a history of kidney problems, we decided to go ahead and check lab work and urinalysis for a full evaluation.  X-ray confirms subcapital hip fracture. Consulted with orthopedics, and will be admitted to medicine. No significant laboratory abnormalities.  Patient is on Plavix and so surgery may not occur until tomorrow.  Patient / Family / Caregiver informed of clinical course, medical decision-making process, and agree with plan.    ___________________________________________   FINAL CLINICAL  IMPRESSION(S) / ED DIAGNOSES   Final diagnoses:  Femur fracture, right, closed, initial encounter      Lisa Roca, MD 02/09/15 1300

## 2015-02-09 NOTE — ED Notes (Signed)
Family at bedside. 

## 2015-02-09 NOTE — H&P (Signed)
[facility] NAME: Jeremiah Bates    MR#:  [id number]  DATE OF BIRTH:  [birth year]  DATE OF ADMISSION:  02/09/2015  PRIMARY CARE PHYSICIAN: Dicky Doe, MD   REQUESTING/REFERRING PHYSICIAN: Dr Reita Cliche  CHIEF COMPLAINT:  Hip pain  HISTORY OF PRESENT ILLNESS:  Jeremiah Bates  is a 81 y.o. male with a known history of essential HTN, diastolic heart failure and restless leg syndrome who presents with above complaint. Patient states that he has been having muscle spasms in his right hip for the past few days. His medical spasm with severe today that it brought him to his knees and he is unable to get up. His caretaker found and this morning on the ground. He was unable to move and was complaining of pain so EMS was called for further evaluation. Patient denies any trauma. Patient was found to have a right hip fracture. At baseline patient is very functional. He does walk with a walker. He performs all ADLs. He denies palpitations, chest pain or shortness of breath over the past few weeks. He has been in his normal state of health.  PAST MEDICAL HISTORY:   Past Medical History  Diagnosis Date  . Anemia   . Restless leg syndrome   . Seasonal allergies   . Kidney disease   . Hypertension   . Neuropathy   . Prostate cancer   . CHF (congestive heart failure)     mild  . Gastritis   . Stroke     PAST SURGICAL HISTORY:   Past Surgical History  Procedure Laterality Date  . Transurethral resection of prostate    . Appendectomy    . Hernia repair    . Colonoscopy    . Upper gi endoscopy    . Carpal tunnel release    . Cataract extraction Bilateral     SOCIAL HISTORY:   Social History  Substance Use Topics  . Smoking status: Never Smoker   . Smokeless tobacco: Not on file  . Alcohol Use: No    FAMILY HISTORY:  Patient does not recall family history of diabetes, hypertension or stroke.  DRUG ALLERGIES:  No Known  Allergies   REVIEW OF SYSTEMS:  CONSTITUTIONAL: No fever, fatigue or weakness.  EYES: No blurred or double vision.  EARS, NOSE, AND THROAT: No tinnitus or ear pain.  RESPIRATORY: No cough, shortness of breath, wheezing or hemoptysis.  CARDIOVASCULAR: No chest pain, orthopnea, edema.  GASTROINTESTINAL: No nausea, vomiting, diarrhea or abdominal pain.  GENITOURINARY: No dysuria, hematuria.  ENDOCRINE: No polyuria, nocturia,  HEMATOLOGY: No anemia, easy bruising or bleeding SKIN: No rash or lesion. MUSCULOSKELETAL: Positive hip pain NEUROLOGIC: No tingling, numbness, weakness.  PSYCHIATRY: No anxiety or depression.   MEDICATIONS AT HOME:   Prior to Admission medications   Medication Sig Start Date End Date Taking? Authorizing Provider  albuterol (PROVENTIL HFA;VENTOLIN HFA) 108 (90 BASE) MCG/ACT inhaler Inhale 2 puffs into the lungs every 6 (six) hours as needed for wheezing.   Yes Historical Provider, MD  Calcium Carb-Cholecalciferol (CALCIUM 500 +D) 500-400 MG-UNIT TABS Take 1 tablet by mouth daily.   Yes Historical Provider, MD  clonazePAM (KLONOPIN) 0.5 MG tablet Take 0.5 mg by mouth at bedtime.   Yes Historical Provider, MD  clopidogrel (PLAVIX) 75 MG tablet Take 75 mg by mouth daily.   Yes Historical Provider, MD  ferrous sulfate 325 (65 FE) MG tablet Take 325 mg by mouth 3 (three)  times daily with meals.   Yes Historical Provider, MD  furosemide (LASIX) 20 MG tablet Take 1 tablet (20 mg total) by mouth daily as needed. Patient taking differently: Take 10 mg by mouth daily as needed for fluid or edema.  07/22/14 07/22/15 Yes Minna Merritts, MD  levothyroxine (SYNTHROID, LEVOTHROID) 25 MCG tablet Take 25 mcg by mouth daily before breakfast.   Yes Historical Provider, MD  loratadine (CLARITIN) 10 MG tablet Take 10 mg by mouth at bedtime.   Yes Historical Provider, MD  losartan (COZAAR) 50 MG tablet Take 1 tablet (50 mg total) by mouth daily. Patient taking differently: Take 100 mg by  mouth daily.  11/30/13  Yes Minna Merritts, MD  Omega-3 Fatty Acids (FISH OIL) 1200 MG CAPS Take 1 capsule by mouth daily.   Yes Historical Provider, MD  pantoprazole (PROTONIX) 20 MG tablet Take 20 mg by mouth daily.   Yes Historical Provider, MD  pramipexole (MIRAPEX) 1 MG tablet Take 2 mg by mouth at bedtime.   Yes Historical Provider, MD  Probiotic Product (PROBIOTIC PO) Take 1 tablet by mouth daily.   Yes Historical Provider, MD  simvastatin (ZOCOR) 20 MG tablet Take 20 mg by mouth daily at 6 PM.    Yes Historical Provider, MD  tamsulosin (FLOMAX) 0.4 MG CAPS Take 0.4 mg by mouth daily.   Yes Historical Provider, MD  traZODone (DESYREL) 100 MG tablet Take 100 mg by mouth at bedtime.   Yes Historical Provider, MD  verapamil (CALAN-SR) 180 MG CR tablet Take 180 mg by mouth daily.    Yes Historical Provider, MD  vitamin E 400 UNIT capsule Take 400 Units by mouth daily.   Yes Historical Provider, MD      VITAL SIGNS:  Blood pressure 154/76, pulse 90, temperature 98.1 F (36.7 C), temperature source Oral, resp. rate 15, height 5' 1.5" (1.562 m), weight 69.854 kg (154 lb), SpO2 98 %.  PHYSICAL EXAMINATION:  GENERAL:  81 y.o.-year-old patient lying in the bed with no acute distress. Very pleasant EYES: Pupils equal, round, reactive to light and accommodation. No scleral icterus. Extraocular muscles intact.  HEENT: Head atraumatic, normocephalic. Oropharynx and nasopharynx clear.  NECK:  Supple, no jugular venous distention. No thyroid enlargement, no tenderness.  LUNGS: Normal breath sounds bilaterally, no wheezing, rales,rhonchi or crepitation. No use of accessory muscles of respiration.  CARDIOVASCULAR: S1, S2 normal. 1/6 SEM NO rubs, or gallops.  ABDOMEN: Soft, nontender, nondistended. Bowel sounds present. No organomegaly or mass.  EXTREMITIES: No pedal edema, cyanosis, or clubbing. Right hip pain NEUROLOGIC: Cranial nerves II through XII are grossly intact. No focal  deficits. PSYCHIATRIC: The patient is alert and oriented x 3.  SKIN: No obvious rash, lesion, or ulcer.   LABORATORY PANEL:   CBC  Recent Labs Lab 02/09/15 1051  WBC 8.6  HGB 10.2*  HCT 29.9*  PLT 304   ------------------------------------------------------------------------------------------------------------------  Chemistries   Recent Labs Lab 02/09/15 1051  NA 131*  K 4.2  CL 96*  CO2 27  GLUCOSE 115*  BUN 26*  CREATININE 1.55*  CALCIUM 8.8*  AST 28  ALT 15*  ALKPHOS 58  BILITOT 0.4   ------------------------------------------------------------------------------------------------------------------  Cardiac Enzymes  Recent Labs Lab 02/09/15 1051  TROPONINI <0.03   ------------------------------------------------------------------------------------------------------------------  RADIOLOGY:  Dg Hip Unilat With Pelvis 2-3 Views Right  02/09/2015   CLINICAL DATA:  Right hip pain after fall.  EXAM: DG HIP (WITH OR WITHOUT PELVIS) 2-3V RIGHT  COMPARISON:  None.  FINDINGS:  There is a mildly displaced fracture involving the proximal right femur. This appears to represent a subcapital femur fracture. There is joint space narrowing in both hip joints, left side greater than right. Sclerosis involving the superior left femoral head is probably degenerative in etiology. Pelvic bony structures are intact.  IMPRESSION: Minimally displaced right femoral subcapital fracture.  Bilateral hip osteoarthritis, left side greater than right.   Electronically Signed   By: Markus Daft M.D.   On: 02/09/2015 10:21    EKG:  Sinus rhythm no ST elevation or depression  IMPRESSION AND PLAN:  81 year old male who is complaining of muscle spasms that brought him to the floor and is now found to have a minimally displaced right femoral subcapital fracture.  1. Preop evaluation for displaced right femoral subcapital fracture: Patient is moderate risk for moderate procedure. Patient is  currently on Plavix which I will hold. I will also have cardiology see the patient in consultation. Patient may receive to surgery as planned tomorrow without further cardiac workup.  2. Minimally displaced right femoral subcapital fracture: Patient will go to the OR in a.m. DVT prophylaxis per surgery. Patient will need physical therapy after surgery and clinical social worker for placement. Continue pain medications and supportive care.  3. Essential hypertension: Patient's blood pressure slightly elevated due to pain. Patient will continue his outpatient medications. Continue losartan and verapamil. 4. Mild diastolic heart failure chronic: Patient does not have exacerbation at this time. Patient is on Lasix which I'll continue.  5. Hyponatremia: Patient will receive IV fluids and we will repeat a BMP in a.m. I suspect his hyponatremia secondary to mild dehydration.  6. BPH: Continue tamsulosin. 7. History of lacunar stroke: Patient is on Plavix for this. Post will be held for now.  8. Chronic kidney disease stage III: Patient's creatinine is at baseline. Hold nephrotoxic agents. 9. Hypothyroidism: Patient is on Synthroid which I will continue.    All the records are reviewed and case discussed with ED provider. Management plans discussed with the patient and he is in agreement.  CODE STATUS: DNR  TOTAL TIME TAKING CARE OF THIS PATIENT: 50 minutes.    Laquashia Mergenthaler M.D on 02/09/2015 at 1:29 PM  Between 7am to 6pm - Pager - [phone] After 6pm go to [email or web address] - password [email or web address] [facility]  Office  [phone]  CC: Primary care physician; Dicky Doe, MD

## 2015-02-09 NOTE — ED Notes (Signed)
Per EMS, pt had fallen around 0330 today, was on floor 2-3 hrs, EMS reports pt was amb on scene , pt c/o rt hip pain.  No shortening or rotation noted to RLE

## 2015-02-09 NOTE — Consult Note (Signed)
ORTHOPAEDIC CONSULTATION  REQUESTING PHYSICIAN: Bettey Costa, MD  Chief Complaint: Right hip pain  HPI: Jeremiah Bates is a 81 y.o. male who complains of  right hip pain following an incident last night when he got out of bed.  He went to his knees, but didn't really fall on the hip.  His caregiver found him on the floor this morning however.  He was brought to the emergency room where exam and x-rays reveal a hairline nondisplaced right femoral neck fracture.  He is admitted for medical evaluation and surgery.  I have discussed treatment options with his stepdaughter.  I recommended a percutaneous pinning of the hip due to the nondisplaced nature of the fracture and she is in agreement.  We'll plan to do this tomorrow.  He is on Plavix and would allow some of this to dissipate.  Risks and benefits were discussed with the patient and his stepdaughter.  Past Medical History  Diagnosis Date  . Anemia   . Restless leg syndrome   . Seasonal allergies   . Kidney disease   . Hypertension   . Neuropathy   . Prostate cancer   . CHF (congestive heart failure)     mild  . Gastritis   . Stroke   . Hypothyroid   . Closed right hip fracture    Past Surgical History  Procedure Laterality Date  . Transurethral resection of prostate    . Appendectomy    . Hernia repair    . Colonoscopy    . Upper gi endoscopy    . Carpal tunnel release    . Cataract extraction Bilateral    Social History   Social History  . Marital Status: Married    Spouse Name: N/A  . Number of Children: N/A  . Years of Education: N/A   Social History Main Topics  . Smoking status: Never Smoker   . Smokeless tobacco: None  . Alcohol Use: No  . Drug Use: No  . Sexual Activity: Not Asked   Other Topics Concern  . None   Social History Narrative   Family History  Problem Relation Age of Onset  . Family history unknown: Yes   No Known Allergies Prior to Admission medications   Medication Sig Start Date  End Date Taking? Authorizing Provider  albuterol (PROVENTIL HFA;VENTOLIN HFA) 108 (90 BASE) MCG/ACT inhaler Inhale 2 puffs into the lungs every 6 (six) hours as needed for wheezing.   Yes Historical Provider, MD  Calcium Carb-Cholecalciferol (CALCIUM 500 +D) 500-400 MG-UNIT TABS Take 1 tablet by mouth daily.   Yes Historical Provider, MD  clonazePAM (KLONOPIN) 0.5 MG tablet Take 0.5 mg by mouth at bedtime.   Yes Historical Provider, MD  clopidogrel (PLAVIX) 75 MG tablet Take 75 mg by mouth daily.   Yes Historical Provider, MD  ferrous sulfate 325 (65 FE) MG tablet Take 325 mg by mouth 3 (three) times daily with meals.   Yes Historical Provider, MD  furosemide (LASIX) 20 MG tablet Take 1 tablet (20 mg total) by mouth daily as needed. Patient taking differently: Take 10 mg by mouth daily as needed for fluid or edema.  07/22/14 07/22/15 Yes Minna Merritts, MD  levothyroxine (SYNTHROID, LEVOTHROID) 25 MCG tablet Take 25 mcg by mouth daily before breakfast.   Yes Historical Provider, MD  loratadine (CLARITIN) 10 MG tablet Take 10 mg by mouth at bedtime.   Yes Historical Provider, MD  losartan (COZAAR) 50 MG tablet Take 1 tablet (50  mg total) by mouth daily. Patient taking differently: Take 100 mg by mouth daily.  11/30/13  Yes Minna Merritts, MD  Omega-3 Fatty Acids (FISH OIL) 1200 MG CAPS Take 1 capsule by mouth daily.   Yes Historical Provider, MD  pantoprazole (PROTONIX) 20 MG tablet Take 20 mg by mouth daily.   Yes Historical Provider, MD  pramipexole (MIRAPEX) 1 MG tablet Take 2 mg by mouth at bedtime.   Yes Historical Provider, MD  Probiotic Product (PROBIOTIC PO) Take 1 tablet by mouth daily.   Yes Historical Provider, MD  simvastatin (ZOCOR) 20 MG tablet Take 20 mg by mouth daily at 6 PM.    Yes Historical Provider, MD  tamsulosin (FLOMAX) 0.4 MG CAPS Take 0.4 mg by mouth daily.   Yes Historical Provider, MD  traZODone (DESYREL) 100 MG tablet Take 100 mg by mouth at bedtime.   Yes Historical  Provider, MD  verapamil (CALAN-SR) 180 MG CR tablet Take 180 mg by mouth daily.    Yes Historical Provider, MD  vitamin E 400 UNIT capsule Take 400 Units by mouth daily.   Yes Historical Provider, MD   Dg Chest Port 1 View  02/09/2015   CLINICAL DATA:  Preop hip surgery for fracture  EXAM: PORTABLE CHEST - 1 VIEW  COMPARISON:  11/30/2011  FINDINGS: Cardiac enlargement . Thoracic aorta atherosclerotic disease. Mild vascular congestion without heart failure or edema. No pleural effusion. Negative for pneumonia.  Left upper lobe density unchanged from the prior study and probably representing costochondral calcification of the first rib.  IMPRESSION: Cardiac enlargement with mild vascular congestion.   Electronically Signed   By: Franchot Gallo M.D.   On: 02/09/2015 13:36   Dg Hip Unilat With Pelvis 2-3 Views Right  02/09/2015   CLINICAL DATA:  Right hip pain after fall.  EXAM: DG HIP (WITH OR WITHOUT PELVIS) 2-3V RIGHT  COMPARISON:  None.  FINDINGS: There is a mildly displaced fracture involving the proximal right femur. This appears to represent a subcapital femur fracture. There is joint space narrowing in both hip joints, left side greater than right. Sclerosis involving the superior left femoral head is probably degenerative in etiology. Pelvic bony structures are intact.  IMPRESSION: Minimally displaced right femoral subcapital fracture.  Bilateral hip osteoarthritis, left side greater than right.   Electronically Signed   By: Markus Daft M.D.   On: 02/09/2015 10:21    Positive ROS: All other systems have been reviewed and were otherwise negative with the exception of those mentioned in the HPI and as above.  Physical Exam: General: Alert, no acute distress Cardiovascular: No pedal edema Respiratory: No cyanosis, no use of accessory musculature GI: No organomegaly, abdomen is soft and non-tender Skin: No lesions in the area of chief complaint Neurologic: Sensation intact distally Psychiatric:  Patient is competent for consent with normal mood and affect Lymphatic: No axillary or cervical lymphadenopathy  MUSCULOSKELETAL: Alert and eating dinner.  Right leg shows no shortening, but there is pain with range of motion.  Neurovascular status is good.  The skin is intact.  Assessment: Nondisplaced right subcapital hip fracture  Plan: Percutaneous pinning, right hip tomorrow.    Park Breed, MD [phone]   02/09/2015 5:29 PM

## 2015-02-10 ENCOUNTER — Encounter
Admission: EM | Disposition: A | Discharge status: Skilled Nursing Facility | Payer: Self-pay | Source: Home / Self Care | Attending: Internal Medicine

## 2015-02-10 ENCOUNTER — Encounter: Payer: Self-pay | Admitting: Physician Assistant

## 2015-02-10 ENCOUNTER — Inpatient Hospital Stay: Payer: Medicare HMO | Admitting: Anesthesiology

## 2015-02-10 ENCOUNTER — Inpatient Hospital Stay: Payer: Medicare HMO

## 2015-02-10 DIAGNOSIS — Z7189 Other specified counseling: Secondary | ICD-10-CM

## 2015-02-10 DIAGNOSIS — Z01810 Encounter for preprocedural cardiovascular examination: Secondary | ICD-10-CM

## 2015-02-10 DIAGNOSIS — I5032 Chronic diastolic (congestive) heart failure: Secondary | ICD-10-CM

## 2015-02-10 DIAGNOSIS — Z8673 Personal history of transient ischemic attack (TIA), and cerebral infarction without residual deficits: Secondary | ICD-10-CM

## 2015-02-10 DIAGNOSIS — D649 Anemia, unspecified: Secondary | ICD-10-CM

## 2015-02-10 DIAGNOSIS — S7290XA Unspecified fracture of unspecified femur, initial encounter for closed fracture: Secondary | ICD-10-CM

## 2015-02-10 DIAGNOSIS — S7291XA Unspecified fracture of right femur, initial encounter for closed fracture: Secondary | ICD-10-CM

## 2015-02-10 HISTORY — PX: HIP PINNING,CANNULATED: SHX1758

## 2015-02-10 LAB — BASIC METABOLIC PANEL
ANION GAP: 9 (ref 5–15)
BUN: 26 mg/dL — ABNORMAL HIGH (ref 6–20)
CALCIUM: 8.5 mg/dL — AB (ref 8.9–10.3)
CHLORIDE: 98 mmol/L — AB (ref 101–111)
CO2: 25 mmol/L (ref 22–32)
CREATININE: 1.54 mg/dL — AB (ref 0.61–1.24)
GFR, EST AFRICAN AMERICAN: 43 mL/min — AB (ref >60)
GFR, EST NON AFRICAN AMERICAN: 37 mL/min — AB (ref >60)
Glucose, Bld: 93 mg/dL (ref 65–99)
Potassium: 3.9 mmol/L (ref 3.5–5.1)
Sodium: 132 mmol/L — ABNORMAL LOW (ref 135–145)

## 2015-02-10 LAB — CBC
HCT: 28.9 % — ABNORMAL LOW (ref 40.0–52.0)
HEMOGLOBIN: 9.9 g/dL — AB (ref 13.0–18.0)
MCH: 32.6 pg (ref 26.0–34.0)
MCHC: 34.4 g/dL (ref 32.0–36.0)
MCV: 94.8 fL (ref 80.0–100.0)
Platelets: 296 10*3/uL (ref 150–440)
RBC: 3.05 MIL/uL — AB (ref 4.40–5.90)
RDW: 14.5 % (ref 11.5–14.5)
WBC: 9.1 10*3/uL (ref 3.8–10.6)

## 2015-02-10 LAB — VITAMIN D 25 HYDROXY (VIT D DEFICIENCY, FRACTURES): VIT D 25 HYDROXY: 47 ng/mL (ref 30.0–100.0)

## 2015-02-10 SURGERY — FIXATION, FEMUR, NECK, PERCUTANEOUS, USING SCREW
Anesthesia: Monitor Anesthesia Care | Laterality: Right

## 2015-02-10 MED ORDER — FLEET ENEMA 7-19 GM/118ML RE ENEM: 1.0000 | ENEMA | Freq: Once | RECTAL | Status: DC | PRN

## 2015-02-10 MED ORDER — BISACODYL 10 MG RE SUPP
10.0000 mg | Freq: Every day | RECTAL | Status: DC | PRN
  Administered 2015-02-14: 10 mg via RECTAL
  Filled 2015-02-10: qty 1

## 2015-02-10 MED ORDER — FENTANYL CITRATE (PF) 100 MCG/2ML IJ SOLN
INTRAMUSCULAR | Status: DC | PRN
  Administered 2015-02-10 (×2): 25 ug via INTRAVENOUS

## 2015-02-10 MED ORDER — METOCLOPRAMIDE HCL 5 MG/ML IJ SOLN: 5.0000 mg | Freq: Three times a day (TID) | INTRAMUSCULAR | Status: DC | PRN

## 2015-02-10 MED ORDER — KETAMINE HCL 50 MG/ML IJ SOLN
INTRAMUSCULAR | Status: DC | PRN
  Administered 2015-02-10 (×2): 25 mg via INTRAMUSCULAR

## 2015-02-10 MED ORDER — MIDAZOLAM HCL 2 MG/2ML IJ SOLN
INTRAMUSCULAR | Status: DC | PRN
  Administered 2015-02-10 (×2): 1 mg via INTRAVENOUS

## 2015-02-10 MED ORDER — ONDANSETRON HCL 4 MG/2ML IJ SOLN: 4.0000 mg | Freq: Once | INTRAMUSCULAR | Status: DC | PRN

## 2015-02-10 MED ORDER — BUPIVACAINE-EPINEPHRINE (PF) 0.5% -1:200000 IJ SOLN
INTRAMUSCULAR | Status: DC | PRN
  Administered 2015-02-10: 30 mL

## 2015-02-10 MED ORDER — FENTANYL CITRATE (PF) 100 MCG/2ML IJ SOLN: 25.0000 ug | INTRAMUSCULAR | Status: DC | PRN

## 2015-02-10 MED ORDER — METOCLOPRAMIDE HCL 5 MG PO TABS: 5.0000 mg | ORAL_TABLET | Freq: Three times a day (TID) | ORAL | Status: DC | PRN

## 2015-02-10 MED ORDER — PROPOFOL INFUSION 10 MG/ML OPTIME
INTRAVENOUS | Status: DC | PRN
  Administered 2015-02-10: 30 ug/kg/min via INTRAVENOUS

## 2015-02-10 MED ORDER — RISAQUAD PO CAPS
1.0000 | ORAL_CAPSULE | Freq: Every day | ORAL | Status: DC
  Administered 2015-02-10 – 2015-02-15 (×6): 1 via ORAL
  Filled 2015-02-10 (×6): qty 1

## 2015-02-10 MED ORDER — ACETAMINOPHEN 500 MG PO TABS
1000.0000 mg | ORAL_TABLET | Freq: Four times a day (QID) | ORAL | Status: AC
  Administered 2015-02-10 – 2015-02-11 (×4): 1000 mg via ORAL
  Filled 2015-02-10 (×4): qty 2

## 2015-02-10 MED ORDER — PHENOL 1.4 % MT LIQD
1.0000 | OROMUCOSAL | Status: DC | PRN
  Filled 2015-02-10: qty 177

## 2015-02-10 MED ORDER — LACTATED RINGERS IV SOLN
INTRAVENOUS | Status: DC | PRN
Start: 2015-02-10 — End: 2015-02-10
  Administered 2015-02-10: 11:00:00 via INTRAVENOUS

## 2015-02-10 MED ORDER — SODIUM CHLORIDE 0.45 % IV SOLN
INTRAVENOUS | Status: DC
  Administered 2015-02-10: 14:00:00 via INTRAVENOUS

## 2015-02-10 MED ORDER — CLOPIDOGREL BISULFATE 75 MG PO TABS
75.0000 mg | ORAL_TABLET | Freq: Every day | ORAL | Status: DC
  Administered 2015-02-11 – 2015-02-15 (×5): 75 mg via ORAL
  Filled 2015-02-10 (×5): qty 1

## 2015-02-10 MED ORDER — CEFAZOLIN SODIUM-DEXTROSE 2-3 GM-% IV SOLR
2.0000 g | Freq: Four times a day (QID) | INTRAVENOUS | Status: AC
  Administered 2015-02-10 (×2): 2 g via INTRAVENOUS
  Filled 2015-02-10 (×2): qty 50

## 2015-02-10 MED ORDER — MAGNESIUM HYDROXIDE 400 MG/5ML PO SUSP
30.0000 mL | Freq: Every day | ORAL | Status: DC | PRN
  Administered 2015-02-11 – 2015-02-14 (×2): 30 mL via ORAL
  Filled 2015-02-10 (×2): qty 30

## 2015-02-10 MED ORDER — OMEGA-3-ACID ETHYL ESTERS 1 G PO CAPS
1.0000 g | ORAL_CAPSULE | Freq: Every day | ORAL | Status: DC
  Administered 2015-02-11 – 2015-02-15 (×5): 1 g via ORAL
  Filled 2015-02-10 (×5): qty 1

## 2015-02-10 MED ORDER — MENTHOL 3 MG MT LOZG: 1.0000 | LOZENGE | OROMUCOSAL | Status: DC | PRN

## 2015-02-10 SURGICAL SUPPLY — 24 items
BAG COUNTER SPONGE EZ (MISCELLANEOUS) ×2 IMPLANT
BLADE SURG SZ11 CARB STEEL (BLADE) ×3 IMPLANT
BNDG COHESIVE 4X5 TAN STRL (GAUZE/BANDAGES/DRESSINGS) ×3 IMPLANT
CHLORAPREP W/TINT 26ML (MISCELLANEOUS) ×3 IMPLANT
COUNTER SPONGE BAG EZ (MISCELLANEOUS) ×1
DRSG AQUACEL AG ADV 3.5X10 (GAUZE/BANDAGES/DRESSINGS) ×6 IMPLANT
GAUZE SPONGE 4X4 12PLY STRL (GAUZE/BANDAGES/DRESSINGS) ×3 IMPLANT
GLOVE BIO SURGEON STRL SZ8 (GLOVE) ×3 IMPLANT
GLOVE SURG ORTHO 8.5 STRL (GLOVE) ×3 IMPLANT
GLOVE SURG XRAY 8.5 LX (GLOVE) ×3 IMPLANT
GOWN STRL REUS W/ TWL LRG LVL3 (GOWN DISPOSABLE) ×2 IMPLANT
GOWN STRL REUS W/TWL LRG LVL3 (GOWN DISPOSABLE) ×4
KIT RM TURNOVER STRD PROC AR (KITS) ×3 IMPLANT
MAT FLOOR SUCTION 50X34 (MISCELLANEOUS) ×3 IMPLANT
NEEDLE SPNL 18GX3.5 QUINCKE PK (NEEDLE) ×3 IMPLANT
NS IRRIG 500ML POUR BTL (IV SOLUTION) ×3 IMPLANT
PACK HIP COMPR (MISCELLANEOUS) ×3 IMPLANT
SCREW CANN 32 THRD/80 7.3 (Screw) ×3 IMPLANT
SCREW CANN 32 THRD/90 7.3 (Screw) ×3 IMPLANT
SCREW CANN 32 THRD/95 7.3 (Screw) ×3 IMPLANT
SCREW CANN THREADED 7.3X85 (Screw) ×3 IMPLANT
STRAP SAFETY BODY (MISCELLANEOUS) ×3 IMPLANT
SUT ETHILON 3 0 FSLX (SUTURE) ×3 IMPLANT
SYR 30ML LL (SYRINGE) ×3 IMPLANT

## 2015-02-10 NOTE — Anesthesia Procedure Notes (Signed)
Procedure Name: MAC Date/Time: 02/10/2015 11:00 AM Performed by: Nelda Marseille Pre-anesthesia Checklist: Patient identified, Emergency Drugs available, Suction available, Patient being monitored and Timeout performed Oxygen Delivery Method: Simple face mask

## 2015-02-10 NOTE — Care Management Note (Signed)
Case Management Note  Patient Details  Name: Jeremiah Bates MRN: [id number] Date of Birth: 1919/11/25  Subjective/Objective:                   Met with patient and his son [name] to discuss discharge planning prior to surgery today. Patient is hard of hearing and decreased vision per son and RN. Patient is from home with his second wife (not Simona Huh' mother) that he has been caring for because she has dementia. Her daughter is working on long-term placement for (wife). He typically uses a rollator for ambulation. Has a tub shower and hand rails over toilet but no bedside commode. He uses [facility] in [place] for Rx. Simona Huh states that patient lives in [place] (originally from [place] which is where his [place] is from) and has [employer] visiting to assist with PCS under private pay. Simona Huh is hopeful that patient's insurance will cover [facility]. Simona Huh plans to take patient back to [place] after rehab. Action/Plan: List of local home health agencies shared with patient/Dennis. I also shared https://www.google.com/webhp?sourceid=chrome-instant&ion=[email or web address]=2&ie=UTF-8#q=home+health+phyical+therapy+in+[place]+[address] which includes [place] home health and outpatient rehab services. He does not have a front-wheeled walker. RNCM will continue to follow. CSW updated.   Expected Discharge Date:                  Expected Discharge Plan:     In-House Referral:  Clinical Social Work  Discharge planning Services  CM Consult  Post Acute Care Choice:  Home Health, Durable Medical Equipment Choice offered to:  Patient, Adult Children [name] [phone])  DME Arranged:    DME Agency:     HH Arranged:    [place] Agency:  [facility]  Status of Service:     Medicare Important Message Given:    Date Medicare IM Given:    Medicare IM give by:    Date Additional Medicare IM Given:    Additional Medicare Important Message give by:     If discussed at [place] of Stay  Meetings, dates discussed:    Additional Comments:  Marshell Garfinkel, RN 02/10/2015, 9:55 AM

## 2015-02-10 NOTE — Anesthesia Preprocedure Evaluation (Addendum)
Anesthesia Evaluation  Patient identified by MRN, date of birth, ID band Patient awake    Reviewed: Allergy & Precautions, NPO status , Patient's Chart, lab work & pertinent test results, reviewed documented beta blocker date and time   Airway Mallampati: II  TM Distance: >3 FB     Dental  (+) Edentulous Upper, Edentulous Lower   Pulmonary shortness of breath,          Cardiovascular hypertension, Pt. on medications +CHF     Neuro/Psych CVA, No Residual Symptoms    GI/Hepatic   Endo/Other  Hypothyroidism   Renal/GU Renal InsufficiencyRenal disease     Musculoskeletal   Abdominal   Peds  Hematology  (+) anemia ,   Anesthesia Other Findings Only a couple of teeth.  Reproductive/Obstetrics                          Anesthesia Physical Anesthesia Plan  ASA: III  Anesthesia Plan: MAC   Post-op Pain Management:    Induction:   Airway Management Planned:   Additional Equipment:   Intra-op Plan:   Post-operative Plan:   Informed Consent: I have reviewed the patients History and Physical, chart, labs and discussed the procedure including the risks, benefits and alternatives for the proposed anesthesia with the patient or authorized representative who has indicated his/her understanding and acceptance.     Plan Discussed with: CRNA  Anesthesia Plan Comments:        Anesthesia Quick Evaluation

## 2015-02-10 NOTE — Anesthesia Postprocedure Evaluation (Signed)
  Anesthesia Post-op Note  Patient: Jeremiah Bates  Procedure(s) Performed: Procedure(s): CANNULATED HIP PINNING (Right)  Anesthesia type:MAC  Patient location: PACU  Post pain: Pain level controlled  Post assessment: Post-op Vital signs reviewed, Patient's Cardiovascular Status Stable, Respiratory Function Stable, Patent Airway and No signs of Nausea or vomiting  Post vital signs: Reviewed and stable  Last Vitals:  Filed Vitals:   02/10/15 1430  BP: 145/59  Pulse: 87  Temp: 36.5 C  Resp: 18    Level of consciousness: awake, alert  and patient cooperative  Complications: No apparent anesthesia complications

## 2015-02-10 NOTE — Progress Notes (Signed)
[facility] NAME: Jeremiah Bates    MR#:  [id number]  DATE OF BIRTH:  26-Apr-1920  SUBJECTIVE:  CHIEF COMPLAINT:   Chief Complaint  Patient presents with  . Fall    Patient is a 81 year old Caucasian male with history of hypertension, diastolic CHF, restless leg syndrome who presents  with hip pain. Pain was significant that he was not able even to get up after fall. He was brought to emergency room for further evaluation and was found to have a right hip fracture. Orthopedic surgeon was consulted who felt that patient needs pinning of right subcapital hip fracture. Patient's last Plavix dose was on 02/08/2015. Discussed with Dr. Hughie Closs who is going to proceed to operating room today. Patient denies any chest pains or shortness of breath on exertion. No history of coronary artery disease. However, history of stroke and CHF.  ROS  VITAL SIGNS: Blood pressure 146/56, pulse 69, temperature 97.9 F (36.6 C), temperature source Oral, resp. rate 16, height 5' 1.5" (1.562 m), weight 69.854 kg (154 lb), SpO2 98 %.  PHYSICAL EXAMINATION:   GENERAL:  81 y.o.-year-old patient lying in the bed with no acute distress. Dry oral mucosa.  EYES: Pupils equal, round, reactive to light and accommodation. No scleral icterus. Extraocular muscles intact.  HEENT: Head atraumatic, normocephalic. Oropharynx and nasopharynx clear.  NECK:  Supple, no jugular venous distention. No thyroid enlargement, no tenderness.  LUNGS: Normal breath sounds bilaterally, no wheezing, rales,rhonchi or crepitation. No use of accessory muscles of respiration.  CARDIOVASCULAR: S1, S2 normal. No murmurs, rubs, or gallops.  ABDOMEN: Soft, nontender, nondistended. Bowel sounds present. No organomegaly or mass.  EXTREMITIES: No pedal edema, cyanosis, or clubbing.  NEUROLOGIC: Cranial nerves II through XII are intact. Muscle strength 5/5 in all extremities. Sensation  intact. Gait not checked.  PSYCHIATRIC: The patient is alert and oriented x 3.  SKIN: No obvious rash, lesion, or ulcer.   ORDERS/RESULTS REVIEWED:   CBC  Recent Labs Lab 02/09/15 1051 02/09/15 1734 02/10/15 0527  WBC 8.6 9.4 9.1  HGB 10.2* 11.2* 9.9*  HCT 29.9* 33.8* 28.9*  PLT 304 342 296  MCV 94.0 94.9 94.8  MCH 31.9 31.4 32.6  MCHC 33.9 33.1 34.4  RDW 14.2 14.3 14.5  LYMPHSABS 0.9*  --   --   MONOABS 0.6  --   --   EOSABS 0.0  --   --   BASOSABS 0.0  --   --    ------------------------------------------------------------------------------------------------------------------  Chemistries   Recent Labs Lab 02/09/15 1051 02/09/15 1734 02/10/15 0527  NA 131*  --  132*  K 4.2  --  3.9  CL 96*  --  98*  CO2 27  --  25  GLUCOSE 115*  --  93  BUN 26*  --  26*  CREATININE 1.55* 1.57* 1.54*  CALCIUM 8.8*  --  8.5*  AST 28  --   --   ALT 15*  --   --   ALKPHOS 58  --   --   BILITOT 0.4  --   --    ------------------------------------------------------------------------------------------------------------------ estimated creatinine clearance is 25.5 mL/min (by C-G formula based on Cr of 1.54). ------------------------------------------------------------------------------------------------------------------ No results for input(s): TSH, T4TOTAL, T3FREE, THYROIDAB in the last 72 hours.  Invalid input(s): FREET3  Cardiac Enzymes  Recent Labs Lab 02/09/15 1051  TROPONINI <0.03   ------------------------------------------------------------------------------------------------------------------ Invalid input(s): POCBNP ---------------------------------------------------------------------------------------------------------------  RADIOLOGY: Dg Chest Port 1 View  02/09/2015  CLINICAL DATA:  Preop hip surgery for fracture  EXAM: PORTABLE CHEST - 1 VIEW  COMPARISON:  11/30/2011  FINDINGS: Cardiac enlargement . Thoracic aorta atherosclerotic disease. Mild vascular  congestion without heart failure or edema. No pleural effusion. Negative for pneumonia.  Left upper lobe density unchanged from the prior study and probably representing costochondral calcification of the first rib.  IMPRESSION: Cardiac enlargement with mild vascular congestion.   Electronically Signed   By: Franchot Gallo M.D.   On: 02/09/2015 13:36   Dg Hip Unilat With Pelvis 2-3 Views Right  02/09/2015   CLINICAL DATA:  Right hip pain after fall.  EXAM: DG HIP (WITH OR WITHOUT PELVIS) 2-3V RIGHT  COMPARISON:  None.  FINDINGS: There is a mildly displaced fracture involving the proximal right femur. This appears to represent a subcapital femur fracture. There is joint space narrowing in both hip joints, left side greater than right. Sclerosis involving the superior left femoral head is probably degenerative in etiology. Pelvic bony structures are intact.  IMPRESSION: Minimally displaced right femoral subcapital fracture.  Bilateral hip osteoarthritis, left side greater than right.   Electronically Signed   By: Markus Daft M.D.   On: 02/09/2015 10:21    EKG:  Orders placed or performed in visit on 07/22/14  . EKG 12-Lead    ASSESSMENT AND PLAN:  Active Problems:   Hip fracture 1. Right subcapital  hip fracture. Patient is to undergo pinning today by Dr. Earnestine Leys. Etiology remains unclear is unclear if in fact he fell down or is it pathologic fracture. Biopsy will be done during operation 2. Renal insufficiency, likely chronic stable 3. Hyponatremia, possibly related to diuretic, Lasix. Hold Lasix and follow patient's sodium level in the morning. Check TSH 4. Anemia. Follow after operative therapy   Management plans discussed with the patient, family, patient's son, Dr. Earnestine Leys, cardiology PA, Mr. Christell Faith and they are in agreement.   DRUG ALLERGIES: No Known Allergies  CODE STATUS:     Code Status Orders        Start     Ordered   02/09/15 1529  Do not attempt  resuscitation (DNR)   Continuous    Question Answer Comment  In the event of cardiac or respiratory ARREST Do not call a "code blue"   In the event of cardiac or respiratory ARREST Do not perform Intubation, CPR, defibrillation or ACLS   In the event of cardiac or respiratory ARREST Use medication by any route, position, wound care, and other measures to relive pain and suffering. May use oxygen, suction and manual treatment of airway obstruction as needed for comfort.      02/09/15 1528    Advance Directive Documentation        Most Recent Value   Type of Advance Directive  Living will   Pre-existing out of facility DNR order (yellow form or pink MOST form)     "MOST" Form in Place?        TOTAL TIME TAKING CARE OF THIS PATIENT: 35  minutes.    Theodoro Grist M.D on 02/10/2015 at 11:48 AM  Between 7am to 6pm - Pager - [phone]  After 6pm go to [email or web address] - password [email or web address] [facility]  Office  [phone]  CC: Primary care physician; Dicky Doe, MD

## 2015-02-10 NOTE — Transfer of Care (Signed)
Immediate Anesthesia Transfer of Care Note  Patient: Jeremiah Bates  Procedure(s) Performed: Procedure(s): CANNULATED HIP PINNING (Right)  Patient Location: PACU  Anesthesia Type:MAC  Level of Consciousness: sedated  Airway & Oxygen Therapy: Patient Spontanous Breathing and Patient connected to face mask oxygen  Post-op Assessment: Report given to RN and Post -op Vital signs reviewed and stable  Post vital signs: Reviewed and stable  Last Vitals:  Filed Vitals:   02/10/15 1210  BP: 127/64  Pulse: 79  Temp: 36.3 C  Resp: 14    Complications: No apparent anesthesia complications

## 2015-02-10 NOTE — Consult Note (Addendum)
Cardiology Consultation Note  Patient ID: AMER ALCINDOR, MRN: [id number], DOB/AGE: [birth year] 79 y.o. Admit date: 02/09/2015   Date of Consult: 02/10/2015 Primary Physician: Dicky Doe, MD Primary Cardiologist: Dr. Rockey Situ, MD  Chief Complaint: Fall in the setting of muscle spasm with right hip pain, found to have closed nondisplaced right hip fracture Reason for Consult: Medication management, Re: Plavix and cardiac clearance   HPI: 81 y.o. male with h/o mild diastolic CHF, prior stroke for which he is on Plavix by neurology, CKD stage III, HTN, prostate cancer, hypothyroidism, RLS, and chronic anemia who presented to [facility] on 8/8 after a fall and suffered a nondisplaced right subcapital hip fracture. Cardiology is consulted for management of his Plavix and cardiac clearance.   He has known mild chronic diastolic CHF with last echo in 10/2012 that showed normal LV systolic function, no regional wall motion abnormalities, grade 1 diastolic dysfunction, mild aortic insufficiency, and normal right-sided pressures. He is not on longstanding daily diuretic. He was previously a Psychologist, sport and exercise and lived a very active lifestyle. He continues to be fairly active. He denies any exertional anginal symptoms including chest pain, SOB, diaphoresis, nausea, vomiting, presyncope, or syncope. Prior to his episode of muscle spasm with associated hip fracture he was able to ambulate without any issues. He does not have stairs in his house, but when he would go out need to ambulate with stairs he would not have issues with them. He reports having had a stress test a few years back that was normal. I am unable to locate these results. He does not have any residual weakness from his prior stroke.   Patient suffered a fall in the setting of muscle spasm attempting to get out of bed on the evening of 8/9, going down to his knees. He did not fall on his hip. His caregiver found him on the floor. He was brought the ED and was  found to have a nondisplaced subcapital right hip fracture. He last took his Plavix on 8/9. He is scheduled to go to the OR today for rip hip pinning. Labs show he is approximately at his baseline renal function.  His does have acute on chronic anemia. CK was found to be ok at 213. Troponin was negative x 1. EKG showed sinus rhythm, 96 bpm, 1st degree AV block, no significant st/t changes. CXR showed mild vascular congestion. He does not have any concerns and is resting comfortably in his bed, fully supine.     Past Medical History  Diagnosis Date  . Anemia   . Restless leg syndrome   . Seasonal allergies   . CKD (chronic kidney disease), stage III   . Hypertension   . Neuropathy   . Prostate cancer   . Chronic diastolic CHF (congestive heart failure)     a. echo 10/2012: EF 55-60%, mild LVH, nl WM, GR1DD, mild AI, PASP nl  . Gastritis   . Stroke 2014    a. on Plavix by neurology  . Hypothyroid   . Closed right hip fracture 01/2015      Most Recent Cardiac Studies: Echo 10/2012:   Study Conclusions  - Left ventricle: The cavity size was normal. Wall thickness was increased in a pattern of mild LVH. Systolic function was normal. The estimated ejection fraction was in the range of 55% to 60%. Wall motion was normal; there were no regional wall motion abnormalities. Doppler parameters are consistent with abnormal left ventricular relaxation (grade 1 diastolic dysfunction). -  Aortic valve: Mild regurgitation. - Right ventricle: Systolic function was normal. - Pulmonary arteries: Systolic pressure was within the normal range.   Surgical History:  Past Surgical History  Procedure Laterality Date  . Transurethral resection of prostate    . Appendectomy    . Hernia repair    . Colonoscopy    . Upper gi endoscopy    . Carpal tunnel release    . Cataract extraction Bilateral      Home Meds: Prior to Admission medications   Medication Sig Start Date End Date  Taking? Authorizing Provider  albuterol (PROVENTIL HFA;VENTOLIN HFA) 108 (90 BASE) MCG/ACT inhaler Inhale 2 puffs into the lungs every 6 (six) hours as needed for wheezing.   Yes Historical Provider, MD  Calcium Carb-Cholecalciferol (CALCIUM 500 +D) 500-400 MG-UNIT TABS Take 1 tablet by mouth daily.   Yes Historical Provider, MD  clonazePAM (KLONOPIN) 0.5 MG tablet Take 0.5 mg by mouth at bedtime.   Yes Historical Provider, MD  clopidogrel (PLAVIX) 75 MG tablet Take 75 mg by mouth daily.   Yes Historical Provider, MD  ferrous sulfate 325 (65 FE) MG tablet Take 325 mg by mouth 3 (three) times daily with meals.   Yes Historical Provider, MD  furosemide (LASIX) 20 MG tablet Take 1 tablet (20 mg total) by mouth daily as needed. Patient taking differently: Take 10 mg by mouth daily as needed for fluid or edema.  07/22/14 07/22/15 Yes Minna Merritts, MD  levothyroxine (SYNTHROID, LEVOTHROID) 25 MCG tablet Take 25 mcg by mouth daily before breakfast.   Yes Historical Provider, MD  loratadine (CLARITIN) 10 MG tablet Take 10 mg by mouth at bedtime.   Yes Historical Provider, MD  losartan (COZAAR) 50 MG tablet Take 1 tablet (50 mg total) by mouth daily. Patient taking differently: Take 100 mg by mouth daily.  11/30/13  Yes Minna Merritts, MD  Omega-3 Fatty Acids (FISH OIL) 1200 MG CAPS Take 1 capsule by mouth daily.   Yes Historical Provider, MD  pantoprazole (PROTONIX) 20 MG tablet Take 20 mg by mouth daily.   Yes Historical Provider, MD  pramipexole (MIRAPEX) 1 MG tablet Take 2 mg by mouth at bedtime.   Yes Historical Provider, MD  Probiotic Product (PROBIOTIC PO) Take 1 tablet by mouth daily.   Yes Historical Provider, MD  simvastatin (ZOCOR) 20 MG tablet Take 20 mg by mouth daily at 6 PM.    Yes Historical Provider, MD  tamsulosin (FLOMAX) 0.4 MG CAPS Take 0.4 mg by mouth daily.   Yes Historical Provider, MD  traZODone (DESYREL) 100 MG tablet Take 100 mg by mouth at bedtime.   Yes Historical Provider, MD   verapamil (CALAN-SR) 180 MG CR tablet Take 180 mg by mouth daily.    Yes Historical Provider, MD  vitamin E 400 UNIT capsule Take 400 Units by mouth daily.   Yes Historical Provider, MD    Inpatient Medications:  . calcium-vitamin D  1 tablet Oral Daily  .  ceFAZolin (ANCEF) IV  1 g Intravenous Once  . clonazePAM  0.5 mg Oral QHS  . ferrous sulfate  325 mg Oral TID WC  . heparin  5,000 Units Subcutaneous 3 times per day  . levothyroxine  25 mcg Oral QAC breakfast  . loratadine  10 mg Oral QHS  . losartan  50 mg Oral Daily  . pantoprazole  40 mg Oral Daily  . pramipexole  2 mg Oral QHS  . senna  1 tablet Oral BID  .  simvastatin  20 mg Oral q1800  . sodium chloride  3 mL Intravenous Q12H  . tamsulosin  0.4 mg Oral Daily  . traZODone  100 mg Oral QHS  . verapamil  180 mg Oral Daily  . vitamin E  400 Units Oral Daily   . sodium chloride 75 mL/hr at 02/09/15 1755    Allergies: No Known Allergies  Social History   Social History  . Marital Status: Married    Spouse Name: N/A  . Number of Children: N/A  . Years of Education: N/A   Occupational History  . Not on file.   Social History Main Topics  . Smoking status: Never Smoker   . Smokeless tobacco: Not on file  . Alcohol Use: No  . Drug Use: No  . Sexual Activity: Not on file   Other Topics Concern  . Not on file   Social History Narrative     Family History  Problem Relation Age of Onset  . Family history unknown: Yes     Review of Systems: Review of Systems  Constitutional: Negative for fever, chills, weight loss, malaise/fatigue and diaphoresis.  HENT: Negative for congestion.   Eyes: Negative for discharge and redness.  Respiratory: Negative for cough, hemoptysis, sputum production, shortness of breath and wheezing.   Cardiovascular: Negative for chest pain, palpitations, orthopnea, claudication, leg swelling and PND.  Gastrointestinal: Negative for heartburn, nausea, vomiting, abdominal pain, blood in  stool and melena.  Genitourinary: Negative for hematuria.  Musculoskeletal: Positive for myalgias, joint pain and falls.       Right hip  Skin: Negative for rash.  Neurological: Positive for weakness. Negative for dizziness, sensory change, speech change and focal weakness.  Endo/Heme/Allergies: Does not bruise/bleed easily.  Psychiatric/Behavioral: The patient is not nervous/anxious.   All other systems reviewed and are negative.   Labs:  Recent Labs  02/09/15 1051  CKTOTAL 213  TROPONINI <0.03   Lab Results  Component Value Date   WBC 9.1 02/10/2015   HGB 9.9* 02/10/2015   HCT 28.9* 02/10/2015   MCV 94.8 02/10/2015   PLT 296 02/10/2015    Recent Labs Lab 02/09/15 1051  02/10/15 0527  NA 131*  --  132*  K 4.2  --  3.9  CL 96*  --  98*  CO2 27  --  25  BUN 26*  --  26*  CREATININE 1.55*  < > 1.54*  CALCIUM 8.8*  --  8.5*  PROT 7.1  --   --   BILITOT 0.4  --   --   ALKPHOS 58  --   --   ALT 15*  --   --   AST 28  --   --   GLUCOSE 115*  --  93  < > = values in this interval not displayed. No results found for: CHOL, HDL, LDLCALC, TRIG No results found for: DDIMER  Radiology/Studies:  Dg Chest Port 1 View  02/09/2015   CLINICAL DATA:  Preop hip surgery for fracture  EXAM: PORTABLE CHEST - 1 VIEW  COMPARISON:  11/30/2011  FINDINGS: Cardiac enlargement . Thoracic aorta atherosclerotic disease. Mild vascular congestion without heart failure or edema. No pleural effusion. Negative for pneumonia.  Left upper lobe density unchanged from the prior study and probably representing costochondral calcification of the first rib.  IMPRESSION: Cardiac enlargement with mild vascular congestion.   Electronically Signed   By: Franchot Gallo M.D.   On: 02/09/2015 13:36   Dg Hip Unilat With  Pelvis 2-3 Views Right  02/09/2015   CLINICAL DATA:  Right hip pain after fall.  EXAM: DG HIP (WITH OR WITHOUT PELVIS) 2-3V RIGHT  COMPARISON:  None.  FINDINGS: There is a mildly displaced fracture  involving the proximal right femur. This appears to represent a subcapital femur fracture. There is joint space narrowing in both hip joints, left side greater than right. Sclerosis involving the superior left femoral head is probably degenerative in etiology. Pelvic bony structures are intact.  IMPRESSION: Minimally displaced right femoral subcapital fracture.  Bilateral hip osteoarthritis, left side greater than right.   Electronically Signed   By: Markus Daft M.D.   On: 02/09/2015 10:21    EKG: NSR, 96 bpm, 1st degree AV block, no significant st/t changes  Weights: Filed Weights   02/09/15 0945  Weight: 154 lb (69.854 kg)     Physical Exam: Blood pressure 123/47, pulse 66, temperature 98.3 F (36.8 C), temperature source Oral, resp. rate 16, height 5' 1.5" (1.562 m), weight 154 lb (69.854 kg), SpO2 97 %. Body mass index is 28.63 kg/(m^2). General: Well developed, well nourished, in no acute distress. Head: Normocephalic, atraumatic, sclera non-icteric, no xanthomas, nares are without discharge.  Neck: Negative for carotid bruits. JVD not elevated. Lungs: Diminished breath sounds bilaterally, without wheezes, rales, or rhonchi. Breathing is unlabored. Heart: RRR with S1 S2. No murmurs, rubs, or gallops appreciated. Abdomen: Soft, non-tender, non-distended with normoactive bowel sounds. No hepatomegaly. No rebound/guarding. No obvious abdominal masses. Msk:  Strength and tone appear normal for age. Extremities: No clubbing or cyanosis. No edema.  Distal pedal pulses are 2+ and equal bilaterally. Neuro: Alert and oriented X 3. No facial asymmetry. No focal deficit. Moves all extremities spontaneously. Psych:  Responds to questions appropriately with a normal affect.    Assessment and Plan:  81 y.o. male with h/o mild diastolic CHF, prior stroke for which he is on Plavix by neurology, CKD stage III, HTN, prostate cancer, hypothyroidism, RLS, and chronic anemia who presented to [facility] on 8/8  after a suffering a right leg muscle spasm leading to a fall and suffering a nondisplaced right subcapital hip fracture. Cardiology is consulted for management of his Plavix and cardiac clearance.  1. Pre-op evaluation: - Patient is on Plavix for his prior stroke occuring in 2014 - No recent or prior coronary stenting in this patient - He last took his Plavix on 8/9, this has been held since his admission. Typically this is held 5 days prior to surgery. However, given the acute hip fracture, possibility of ongoing bleed, need for repair, plan for pinning will defer to orthopedics on the timing of procedure  - He is cleared for non-cardiac surgery at a moderate risk per modified Lee criteria giving him an estimated rate of MI, PE, pulmonary edema, Vfib, cardiac arrest, or complete heart block at 6.6% - Would attempt to limit IV fluids intraoperatively and post op given his diastolic dysfunction   - He is not on dual therapy with aspirin as there is no previously known CAD - Would aim to restart aspirin vs Plavix in the post op period when able per orthopedics   2. Mild chronic diastolic CHF: - Not volume overloaded  - Limit IV fluids as above  - Continue Lasix 20 mg prn (home dose), if needed post op may need to schedule this daily - Continue remaining home medications  3. Nondisplaced right subcapital hip fracture: - Planning for OR today per orthopedics   -Limits IV  fluids as above  4. History of palpitations: - None recently  5. History of stroke in 2014: - On Plavix - This has been held since 78/10 - Restart as above when able - No residual symptoms - Followed by PCP and neurology  6. Acute on chronic anemia: - Likely in the setting of hip fracture - Continue to monitor - Transfuse to maintain hgb >8.5  7. CKD stage III: - Stable - Continue to monitor  8. HTN: - Stable - Continue home medications  Signed, Christell Faith, PA-C Pager: [phone] 02/10/2015, 7:32  AM   Attending Note Patient seen and examined, agree with detailed note above,  Patient presentation and plan discussed on rounds.   Acceptable risk for right hip surgery, off Plavix 2 days. We'll defer final decision to orthopedics  Moderate risk of cardiac issues in the peri-operative, postoperative. Given underlying anemia, renal dysfunction, age, history of diastolic CHF -Would try to minimize IV fluids post operatively. --Prior cardiac workup with echocardiogram in 2014 was essentially normal No further cardiac workup indicated at this time  Long discussion with son, Patient has had significant constipation, no bowel movements this week May need aggressive GI cocktail We'll monitor while inpatient for any cardiac complications  Signed: Esmond Plants  M.D., Ph.D. [facility]

## 2015-02-10 NOTE — Op Note (Signed)
02/09/2015 - 02/10/2015  12:03 PM  PATIENT:  Jeremiah Bates    PRE-OPERATIVE DIAGNOSIS: Nondisplaced subcapital fracture right hip  POST-OPERATIVE DIAGNOSIS:  Same  PROCEDURE:  Percutaneous right CANNULATED HIP PINNING  SURGEON:  Park Breed, MD     ANESTHESIA:   General  PREOPERATIVE INDICATIONS:  COLLIS THEDE is a  81 y.o. male who fell and was found to have a diagnosis of fractured right hip who elected for surgical management.    The risks benefits and alternatives were discussed with the patient preoperatively including but not limited to the risks of infection, bleeding, nerve injury, cardiopulmonary complications, blood clots, malunion, nonunion, avascular necrosis, the need for revision surgery, the potential for conversion to hemiarthroplasty, among others, and the patient was willing to proceed.  OPERATIVE IMPLANTS: 7.3 mm cannulated screws x4  OPERATIVE FINDINGS: Clinical osteoporosis with weak bone, proximal femur  OPERATIVE PROCEDURE: The patient was brought to the operating room and placed in supine position. IV antibiotics were given. General anesthesia administered.  The patient was placed on the fracture table. The operative extremity was positioned, without any significant reduction maneuver and was prepped and draped in usual sterile fashion.  Time out was performed.  Small incision was made distal to the greater trochanter, and 4 guidewires were introduced into the head and neck. The lengths were measured. The reduction was slightly valgus, and near-anatomic. I opened the cortex with a cannulated drill, and then placed the screws into position. Satisfactory fixation was achieved.  The wounds were irrigated copiously, and repaired with  3-O Nylon with and sterile gauze. Sponge and needle count were correct.  There no complications and the patient tolerated the procedure well.  The patient will be touch down weightbearing as tolerated, and will be placed  back on Plavix for DVT prophylaxis.   Park Breed, MD

## 2015-02-11 LAB — CBC
HCT: 25.8 % — ABNORMAL LOW (ref 40.0–52.0)
HEMOGLOBIN: 8.8 g/dL — AB (ref 13.0–18.0)
MCH: 32.3 pg (ref 26.0–34.0)
MCHC: 34 g/dL (ref 32.0–36.0)
MCV: 95.1 fL (ref 80.0–100.0)
PLATELETS: 258 10*3/uL (ref 150–440)
RBC: 2.72 MIL/uL — ABNORMAL LOW (ref 4.40–5.90)
RDW: 14.3 % (ref 11.5–14.5)
WBC: 10.4 10*3/uL (ref 3.8–10.6)

## 2015-02-11 LAB — BASIC METABOLIC PANEL
Anion gap: 5 (ref 5–15)
BUN: 26 mg/dL — ABNORMAL HIGH (ref 6–20)
CALCIUM: 8 mg/dL — AB (ref 8.9–10.3)
CO2: 26 mmol/L (ref 22–32)
Chloride: 101 mmol/L (ref 101–111)
Creatinine, Ser: 1.6 mg/dL — ABNORMAL HIGH (ref 0.61–1.24)
GFR calc non Af Amer: 35 mL/min — ABNORMAL LOW (ref >60)
GFR, EST AFRICAN AMERICAN: 41 mL/min — AB (ref >60)
Glucose, Bld: 101 mg/dL — ABNORMAL HIGH (ref 65–99)
POTASSIUM: 3.9 mmol/L (ref 3.5–5.1)
Sodium: 132 mmol/L — ABNORMAL LOW (ref 135–145)

## 2015-02-11 LAB — TSH: TSH: 1.511 u[IU]/mL (ref 0.350–4.500)

## 2015-02-11 MED ORDER — CALCIUM CARBONATE-VITAMIN D 500-200 MG-UNIT PO TABS
1.0000 | ORAL_TABLET | Freq: Two times a day (BID) | ORAL | Status: DC
  Administered 2015-02-11 – 2015-02-15 (×8): 1 via ORAL
  Filled 2015-02-11 (×8): qty 1

## 2015-02-11 MED ORDER — ENSURE ENLIVE PO LIQD
237.0000 mL | Freq: Two times a day (BID) | ORAL | Status: DC
  Administered 2015-02-12 – 2015-02-14 (×6): 237 mL via ORAL

## 2015-02-11 NOTE — Progress Notes (Signed)
Physical Therapy Treatment Patient Details Name: Jeremiah Bates MRN: [id number] DOB: 04-08-1920 Today's Date: 02/11/2015    History of Present Illness  (s/p R hip ORIF)    PT Comments    Pt shows good effort with exercises and attempts at ambulation.  He is able to maintain TTWBing with walker, but does struggle to do more than a simple shuffle with L foot to try to "ambulate."  He shows some small ability to attempt unweighting/hopping but does not appear strong enough in UEs to effectively do this.   Follow Up Recommendations  SNF     Equipment Recommendations       Recommendations for Other Services       Precautions / Restrictions Precautions Precautions: Fall Restrictions Weight Bearing Restrictions: Yes RLE Weight Bearing: Touchdown weight bearing    Mobility  Bed Mobility Overal bed mobility: Needs Assistance Bed Mobility: Sit to Supine     Supine to sit: Max assist;Mod assist        Transfers Overall transfer level: Needs assistance Equipment used: Rolling walker (2 wheeled) Transfers: Sit to/from Stand Sit to Stand: Mod assist         General transfer comment: again pt does surprisingly well with maintaing TTWB  Ambulation/Gait Ambulation/Gait assistance: Mod assist;Max assist Ambulation Distance (Feet): 3 Feet Assistive device: Rolling walker (2 wheeled)       General Gait Details: Pt better able to try some "hopping" but is still very limited with how much he is able to fully unweight with UEs.    Stairs            Wheelchair Mobility    Modified Rankin (Stroke Patients Only)       Balance                                    Cognition Arousal/Alertness: Awake/alert Behavior During Therapy: WFL for tasks assessed/performed Overall Cognitive Status: Difficult to assess                      Exercises General Exercises - Lower Extremity Ankle Circles/Pumps: AROM;10 reps Quad Sets: AROM;10  reps Short Arc Quad: AROM;10 reps Heel Slides: AROM;10 reps Hip ABduction/ADduction: AROM;10 reps Straight Leg Raises: 10 reps;AAROM    General Comments        Pertinent Vitals/Pain Pain Assessment: 0-10 Pain Score: 4     Home Living Family/patient expects to be discharged to:: Skilled nursing facility Living Arrangements: Spouse/significant other (with dementia)             Additional Comments: May go to [place] with children    Prior Function Level of Independence:  (Independent with BADL - aid 5 x per week for 3 hrs, cleaning and shopping)      Comments: used rollator or cane   PT Goals (current goals can now be found in the care plan section) Acute Rehab PT Goals Patient Stated Goal: "I would like to try to go home." PT Goal Formulation: With patient/family Time For Goal Achievement: 02/25/15 Potential to Achieve Goals: Good Progress towards PT goals: Progressing toward goals    Frequency  BID    PT Plan Current plan remains appropriate    Co-evaluation             End of Session Equipment Utilized During Treatment: Gait belt Activity Tolerance: Patient tolerated treatment well Patient left: with bed alarm set  Time: 5747-3403 PT Time Calculation (min) (ACUTE ONLY): 23 min  Charges:  $Gait Training: 8-22 mins $Therapeutic Exercise: 8-22 mins                    G Codes:     Wayne Both, PT, DPT [phone]  Kreg Shropshire 02/11/2015, 5:38 PM

## 2015-02-11 NOTE — Progress Notes (Signed)
[facility] NAME: Jeremiah Bates    MR#:  [id number]  DATE OF BIRTH:  1919-10-23  SUBJECTIVE:  CHIEF COMPLAINT:   Chief Complaint  Patient presents with  . Fall    Patient is a 81 year old Caucasian male with history of hypertension, diastolic CHF, restless leg syndrome who presents  with hip pain. Pain was significant that he was not able even to get up after fall. He was brought to emergency room for further evaluation and was found to have a right hip fracture. Orthopedic surgeon was consulted who felt that patient needs pinning of right subcapital hip fracture. Patient's last Plavix dose was on 02/08/2015.  Underwent percutaneous the right cannulated hip pinning 11 th August , 2016 by Dr. Earnestine Leys, doing well . Mild Pain in left hip. Hemoglobin remains stable. Constipated   Review of Systems  Constitutional: Negative for fever, chills and weight loss.  HENT: Negative for congestion.   Eyes: Negative for blurred vision and double vision.  Respiratory: Negative for cough, sputum production, shortness of breath and wheezing.   Cardiovascular: Negative for chest pain, palpitations, orthopnea, leg swelling and PND.  Gastrointestinal: Negative for nausea, vomiting, abdominal pain, diarrhea, constipation and blood in stool.  Genitourinary: Negative for dysuria, urgency, frequency and hematuria.  Musculoskeletal: Negative for falls.  Neurological: Negative for dizziness, tremors, focal weakness and headaches.  Endo/Heme/Allergies: Does not bruise/bleed easily.  Psychiatric/Behavioral: Negative for depression. The patient does not have insomnia.     VITAL SIGNS: Blood pressure 159/60, pulse 90, temperature 97.9 F (36.6 C), temperature source Oral, resp. rate 18, height 5' 1.5" (1.562 m), weight 69.854 kg (154 lb), SpO2 96 %.  PHYSICAL EXAMINATION:   GENERAL:  81 y.o.-year-old patient lying in the bed with no acute  distress. Dry oral mucosa. Edentulous and  comfortable  EYES: Pupils equal, round, reactive to light and accommodation. No scleral icterus. Extraocular muscles intact.  HEENT: Head atraumatic, normocephalic. Oropharynx and nasopharynx clear.  NECK:  Supple, no jugular venous distention. No thyroid enlargement, no tenderness.  LUNGS: Normal breath sounds bilaterally, no wheezing, rales,rhonchi or crepitation. No use of accessory muscles of respiration.  CARDIOVASCULAR: S1, S2 normal. No murmurs, rubs, or gallops.  ABDOMEN: Soft, nontender, nondistended. Bowel sounds present. No organomegaly or mass.  EXTREMITIES: No pedal edema, cyanosis, or clubbing.  mild edema in the right hip area, which is dressed after operation. No bleeding, discharge or significant tenderness on palpation  NEUROLOGIC: Cranial nerves II through XII are intact. Muscle strength 5/5 in all extremities. Sensation intact. Gait not checked.  PSYCHIATRIC: The patient is alert and oriented x 3.  SKIN: No obvious rash, lesion, or ulcer.   ORDERS/RESULTS REVIEWED:   CBC  Recent Labs Lab 02/09/15 1051 02/09/15 1734 02/10/15 0527 02/11/15 0333  WBC 8.6 9.4 9.1 10.4  HGB 10.2* 11.2* 9.9* 8.8*  HCT 29.9* 33.8* 28.9* 25.8*  PLT 304 342 296 258  MCV 94.0 94.9 94.8 95.1  MCH 31.9 31.4 32.6 32.3  MCHC 33.9 33.1 34.4 34.0  RDW 14.2 14.3 14.5 14.3  LYMPHSABS 0.9*  --   --   --   MONOABS 0.6  --   --   --   EOSABS 0.0  --   --   --   BASOSABS 0.0  --   --   --    ------------------------------------------------------------------------------------------------------------------  Chemistries   Recent Labs Lab 02/09/15 1051 02/09/15 1734 02/10/15 0527 02/11/15 0333  NA  131*  --  132* 132*  K 4.2  --  3.9 3.9  CL 96*  --  98* 101  CO2 27  --  25 26  GLUCOSE 115*  --  93 101*  BUN 26*  --  26* 26*  CREATININE 1.55* 1.57* 1.54* 1.60*  CALCIUM 8.8*  --  8.5* 8.0*  AST 28  --   --   --   ALT 15*  --   --   --   ALKPHOS  58  --   --   --   BILITOT 0.4  --   --   --    ------------------------------------------------------------------------------------------------------------------ estimated creatinine clearance is 24.5 mL/min (by C-G formula based on Cr of 1.6). ------------------------------------------------------------------------------------------------------------------ No results for input(s): TSH, T4TOTAL, T3FREE, THYROIDAB in the last 72 hours.  Invalid input(s): FREET3  Cardiac Enzymes  Recent Labs Lab 02/09/15 1051  TROPONINI <0.03   ------------------------------------------------------------------------------------------------------------------ Invalid input(s): POCBNP ---------------------------------------------------------------------------------------------------------------  RADIOLOGY: Dg Hip Operative Unilat With Pelvis Right  02/10/2015   CLINICAL DATA:  Right hip fracture with pinning  EXAM: OPERATIVE right HIP (WITH PELVIS IF PERFORMED) 2 VIEWS  TECHNIQUE: Fluoroscopic spot image(s) were submitted for interpretation post-operatively.  COMPARISON:  Pelvis a right hip films of 02/09/2015  FINDINGS: Four nails have been placed for fixation of the subcapital right femoral fracture. No complicating features are seen.  IMPRESSION: Pinning of right subcapital femoral fracture.   Electronically Signed   By: Ivar Drape M.D.   On: 02/10/2015 12:21    EKG:  Orders placed or performed in visit on 02/09/15  . EKG 12-Lead  . EKG 12-Lead  . EKG 12-Lead    ASSESSMENT AND PLAN:  Active Problems:   Hip fracture   Femur fracture   Chronic diastolic CHF (congestive heart failure)   Chronic anemia   History of stroke   Encounter for anticoagulation discussion and counseling   Preoperative cardiovascular examination 1. Right subcapital  hip fracture s/p  percutaneous right cannulated hip pinning     11th of August 2016 by Dr. Earnestine Leys. Etiology remains unclear, fall ? or is it  pathologic fracture. biopsy is pending 2. Renal insufficiency, likely chronic stable 3. Hyponatremia, possibly related to diuretic, Lasix. , stable at present while holding Lasix and follow patient's sodium level in the morning. Check TSH 4. Anemia.  relatively stable after operative therapy 5. Vitamin D deficiency. Start calcium with vitamin D  6 . Constipation. Initiate patient on Colace as well as senna   Management plans discussed with the patient,  patient's son  DRUG ALLERGIES: No Known Allergies  CODE STATUS:     Code Status Orders        Start     Ordered   02/09/15 1529  Do not attempt resuscitation (DNR)   Continuous    Question Answer Comment  In the event of cardiac or respiratory ARREST Do not call a "code blue"   In the event of cardiac or respiratory ARREST Do not perform Intubation, CPR, defibrillation or ACLS   In the event of cardiac or respiratory ARREST Use medication by any route, position, wound care, and other measures to relive pain and suffering. May use oxygen, suction and manual treatment of airway obstruction as needed for comfort.      02/09/15 1528    Advance Directive Documentation        Most Recent Value   Type of Advance Directive  Living will   Pre-existing out of facility DNR  order (yellow form or pink MOST form)     "MOST" Form in Place?        TOTAL TIME TAKING CARE OF THIS PATIENT: 35  minutes.    Theodoro Grist M.D on 02/11/2015 at 1:51 PM  Between 7am to 6pm - Pager - [phone]  After 6pm go to [email or web address] - password [email or web address] [facility]  Office  [phone]  CC: Primary care physician; Dicky Doe, MD

## 2015-02-11 NOTE — Progress Notes (Signed)
Patient was a DNR before surgery.  The order did not get re-ordered after surgery.  Dr Ether Griffins gave order for DNR.  Verified by nurse ANA

## 2015-02-11 NOTE — Evaluation (Signed)
Occupational Therapy Evaluation Patient Details Name: Jeremiah Bates MRN: [id number] DOB: [birth year] Today's Date: 02/11/2015    History of Present Illness This patient is a 81 year old male who came to [facility] after a fall suffering an R hip fracture and is now recieved an ORIF repair.   Clinical Impression   This patient is a 81 year old male who came to [facility] after a fall suffering an R hip fracture and is now recieved an ORIF repair. Patient had been living in a home and was independent with activities of daily living and functional mobility using walker or cane.    Follow Up Recommendations  SNF    Equipment Recommendations       Recommendations for Other Services       Precautions / Restrictions Precautions Precautions: Fall Restrictions Weight Bearing Restrictions: Yes RLE Weight Bearing: Touchdown weight bearing      Mobility Bed Mobility    Balance                                            ADL                                         General ADL Comments: Had been independent with basic ADL and mobility using rollator or cane. Practiced with hip kit techniques to Donned/doffed socks and pants to knees using hip kit with hand over hand assist and verbal cues for technique.     Vision     Perception     Praxis      Pertinent Vitals/Pain Pain Assessment: 0-10 Pain Score: 5      Hand Dominance     Extremity/Trunk Assessment Upper Extremity Assessment Upper Extremity Assessment: Generalized weakness         Communication     Cognition Arousal/Alertness: Awake/alert Behavior During Therapy: WFL for tasks assessed/performed Overall Cognitive Status: Difficult to assess                     General Comments       Exercises Exercises: General Lower Extremity     Shoulder Instructions      Home Living Family/patient expects to be discharged to:: Skilled nursing facility Living Arrangements:  Spouse/significant other (with dementia)                               Additional Comments: May go to [place] with children      Prior Functioning/Environment Level of Independence:  (Independent with BADL - aid 5 x per week for 3 hrs, cleaning and shopping)        Comments: used rollator or cane    OT Diagnosis: Acute pain   OT Problem List: Pain;Decreased activity tolerance;Impaired balance (sitting and/or standing)   OT Treatment/Interventions:      OT Goals(Current goals can be found in the care plan section) Acute Rehab OT Goals Patient Stated Goal: "I would like to try to go home." Potential to Achieve Goals: Good  OT Frequency:     Barriers to D/C:            Co-evaluation              End of Session  Equipment Utilized During Treatment:  (hip kit)  Activity Tolerance: Patient tolerated treatment well Patient left:     Time: 4128-7867 OT Time Calculation (min): 25 min Charges:  OT General Charges $OT Visit: 1 Procedure OT Evaluation $Initial OT Evaluation Tier I: 1 Procedure OT Treatments $Self Care/Home Management : 8-22 mins G-Codes:    Myrene Galas, MS/OTR/L   02/11/2015, 4:29 PM

## 2015-02-11 NOTE — Progress Notes (Signed)
Dr Ether Griffins is requesting that I get dietary to consult the pt and order ice cream and ensure milk shakes

## 2015-02-11 NOTE — Progress Notes (Signed)
Initial Nutrition Assessment  INTERVENTION:   Meals and Snacks: Cater to patient preferences Medical Food Supplement Therapy: Agree with supplement of Ensure; will recommend Ensure Enlive po BID, each supplement provides 350 kcal and 20 grams of protein with ice cream as homemade milkshake.   NUTRITION DIAGNOSIS:   Inadequate oral intake related to acute illness as evidenced by meal completion < 25%.  GOAL:   Patient will meet greater than or equal to 90% of their needs  MONITOR:    (Energy Intake, Anthropometrics, Digestive system)  REASON FOR ASSESSMENT:   Consult Poor PO  ASSESSMENT:   Pt admitted after fall, s/p right hip pinning on 02/10/2015  Past Medical History  Diagnosis Date  . Anemia   . Restless leg syndrome   . Seasonal allergies   . CKD (chronic kidney disease), stage III   . Hypertension   . Neuropathy   . Prostate cancer   . Chronic diastolic CHF (congestive heart failure)     a. echo 10/2012: EF 55-60%, mild LVH, nl WM, GR1DD, mild AI, PASP nl  . Gastritis   . Stroke 2014    a. on Plavix by neurology  . Hypothyroid   . Closed right hip fracture 01/2015     Diet Order:  Diet regular Room service appropriate?: Yes; Fluid consistency:: Thin   Current Nutrition: Pt ate bites of tilapia, mashed potatoes and carrots for lunch with 100% of chocolate milk and orange juice. Pt family member reports pt not eating much at lunch is very unlike pt and thinks some of it maybe secondary to constipation.  Food/Nutrition-Related History: Pt family member reports living out of town however when he came to visit last weekend, he noticed pt was eating less than usual and that pt usually has a very good appetite, cleaning his plates. No supplement drinks PTA.   Medications: vitamin E, Senokot, Protonix, Ferrous Sulfate, calcium-vitamin D  Electrolyte/Renal Profile and Glucose Profile:   Recent Labs Lab 02/09/15 1051 02/09/15 1734 02/10/15 0527 02/11/15 0333   NA 131*  --  132* 132*  K 4.2  --  3.9 3.9  CL 96*  --  98* 101  CO2 27  --  25 26  BUN 26*  --  26* 26*  CREATININE 1.55* 1.57* 1.54* 1.60*  CALCIUM 8.8*  --  8.5* 8.0*  GLUCOSE 115*  --  93 101*   Protein Profile:  Recent Labs Lab 02/09/15 1051  ALBUMIN 3.6    Gastrointestinal Profile: Last BM:  02/07/2015   Nutrition-Focused Physical Exam Findings: Nutrition-Focused physical exam completed. Findings are mild fat depletion of triceps only, WDL for muscle depletion, with no edema.    Weight Change: Pt reports weight of 158lbs at nephrologist appointment last week. RD notes weight of 166lbs first of the year (7% weight loss in 8 months)   Skin:  Reviewed, no issues  Height:   Ht Readings from Last 1 Encounters:  02/09/15 5' 1.5" (1.562 m)    Weight:   Wt Readings from Last 1 Encounters:  02/09/15 154 lb (69.854 kg)    BMI:  Body mass index is 28.63 kg/(m^2).  Estimated Nutritional Needs:   Kcal:  1740-2045kcals, BEE: 1056kcals, TEE: (1.1-1.3)(AF 1.3)  Protein:  70-83g protein (1.0-1.2g/kg)  Fluid:  1745-2033mL of fluid (25-30mL/kg)   EDUCATION NEEDS:   No education needs identified at this time   [place], RD, LDN Pager [phone]

## 2015-02-11 NOTE — Progress Notes (Signed)
Subjective: 1 Day Post-Op Procedure(s) (LRB): CANNULATED HIP PINNING (Right)    Patient reports pain as mild. hgb 8.8   Dressing dry.  csm good. oob in chair and eating well. Alert and oriented  Objective:   VITALS:   Filed Vitals:   02/11/15 0935  BP: 159/60  Pulse: 90  Temp:   Resp:     Neurologically intact Sensation intact distally Intact pulses distally Dorsiflexion/Plantar flexion intact No cellulitis present  LABS  Recent Labs  02/09/15 1734 02/10/15 0527 02/11/15 0333  HGB 11.2* 9.9* 8.8*  HCT 33.8* 28.9* 25.8*  WBC 9.4 9.1 10.4  PLT 342 296 258     Recent Labs  02/09/15 1051 02/09/15 1734 02/10/15 0527 02/11/15 0333  NA 131*  --  132* 132*  K 4.2  --  3.9 3.9  BUN 26*  --  26* 26*  CREATININE 1.55* 1.57* 1.54* 1.60*  GLUCOSE 115*  --  93 101*     Recent Labs  02/09/15 1734  INR 1.06     Assessment/Plan: 1 Day Post-Op Procedure(s) (LRB): CANNULATED HIP PINNING (Right)   Advance diet Up with therapy D/C IV fluids

## 2015-02-11 NOTE — Care Management Important Message (Signed)
Important Message  Patient Details  Name: Jeremiah Bates MRN: [id number] Date of Birth: 10-19-1919   Medicare Important Message Given:  Yes-second notification given    Juliann Pulse A Allmond 02/11/2015, 12:59 PM

## 2015-02-11 NOTE — Evaluation (Signed)
Physical Therapy Evaluation Patient Details Name: Jeremiah Bates MRN: [id number] DOB: 10/20/1919 Today's Date: 02/11/2015   History of Present Illness  s/p R hip ORIF  Clinical Impression  Pt shows good ability to maintain nearly NWBing status on R, however he did have issues trying to do any advancing on the L LE to take a step.  He shows good effort with the 10 minutes of exercises apart from the PT exam.  Son helpful and reports that pt will likely be going to [place] to live with another son after rehab.   Follow Up Recommendations SNF    Equipment Recommendations       Recommendations for Other Services       Precautions / Restrictions Precautions Precautions: Fall Restrictions Weight Bearing Restrictions: Yes RLE Weight Bearing: Touchdown weight bearing      Mobility  Bed Mobility Overal bed mobility: Needs Assistance Bed Mobility: Supine to Sit     Supine to sit: Max assist;Mod assist        Transfers Overall transfer level: Needs assistance Equipment used: Rolling walker (2 wheeled) Transfers: Sit to/from Stand Sit to Stand: Mod assist         General transfer comment: Pt does well with transfers and is able to maintain TTWBing status with relative ease  Ambulation/Gait Ambulation/Gait assistance: Mod assist Ambulation Distance (Feet): 3 Feet Assistive device: Rolling walker (2 wheeled)       General Gait Details: Pt is unable to really take any steps, but is able to maintain essentially NWBing with R and shuffles/slides L foot as needed to get to recliner  Stairs            Wheelchair Mobility    Modified Rankin (Stroke Patients Only)       Balance                                             Pertinent Vitals/Pain Pain Assessment: 0-10 Pain Score: 5     Home Living Family/patient expects to be discharged to:: [place]: Spouse/significant other                Additional Comments: possibility that pt will live with a child in [place] after rehab?    Prior Function Level of Independence: Independent with assistive device(s)         Comments: apparently got around pretty well with rollator or cane     Hand Dominance        Extremity/Trunk Assessment   Upper Extremity Assessment: Generalized weakness           Lower Extremity Assessment: RLE deficits/detail RLE Deficits / Details: expected R LE weakness, grossly 3/5, unable to do SLR       Communication      Cognition Arousal/Alertness: Awake/alert Behavior During Therapy: WFL for tasks assessed/performed Overall Cognitive Status: Within Functional Limits for tasks assessed                      General Comments      Exercises General Exercises - Lower Extremity Ankle Circles/Pumps: AROM;10 reps Heel Slides: AROM;10 reps Hip ABduction/ADduction: AROM;10 reps      Assessment/Plan    PT Assessment Patient needs continued PT services  PT Diagnosis Difficulty walking;Generalized weakness;Acute pain   PT Problem List Decreased strength;Decreased activity tolerance;Decreased mobility;Decreased balance;Decreased coordination;Pain  PT Treatment Interventions Gait training;Therapeutic activities;Therapeutic exercise;Balance training;Neuromuscular re-education;DME instruction   PT Goals (Current goals can be found in the Care Plan section) Acute Rehab PT Goals Patient Stated Goal: "I would like to try to go home." PT Goal Formulation: With patient/family Time For Goal Achievement: 02/25/15 Potential to Achieve Goals: Good    Frequency BID   Barriers to discharge        Co-evaluation               End of Session Equipment Utilized During Treatment: Gait belt Activity Tolerance: Patient tolerated treatment well Patient left: with chair alarm set           Time: 9628-3662 PT Time Calculation (min) (ACUTE ONLY): 30 min   Charges:     PT  Treatments $Therapeutic Exercise: 8-22 mins   PT G Codes:       Wayne Both, PT, DPT [phone]  Jeremiah Bates 02/11/2015, 3:46 PM

## 2015-02-12 LAB — BASIC METABOLIC PANEL
ANION GAP: 5 (ref 5–15)
BUN: 29 mg/dL — AB (ref 6–20)
CALCIUM: 8.2 mg/dL — AB (ref 8.9–10.3)
CO2: 27 mmol/L (ref 22–32)
CREATININE: 1.67 mg/dL — AB (ref 0.61–1.24)
Chloride: 99 mmol/L — ABNORMAL LOW (ref 101–111)
GFR calc Af Amer: 39 mL/min — ABNORMAL LOW (ref >60)
GFR calc non Af Amer: 34 mL/min — ABNORMAL LOW (ref >60)
GLUCOSE: 113 mg/dL — AB (ref 65–99)
Potassium: 4.6 mmol/L (ref 3.5–5.1)
SODIUM: 131 mmol/L — AB (ref 135–145)

## 2015-02-12 LAB — CBC
HEMATOCRIT: 26 % — AB (ref 40.0–52.0)
Hemoglobin: 8.8 g/dL — ABNORMAL LOW (ref 13.0–18.0)
MCH: 32.3 pg (ref 26.0–34.0)
MCHC: 34.1 g/dL (ref 32.0–36.0)
MCV: 94.9 fL (ref 80.0–100.0)
PLATELETS: 269 10*3/uL (ref 150–440)
RBC: 2.74 MIL/uL — ABNORMAL LOW (ref 4.40–5.90)
RDW: 14.3 % (ref 11.5–14.5)
WBC: 10.1 10*3/uL (ref 3.8–10.6)

## 2015-02-12 NOTE — Progress Notes (Signed)
Subjective: 2 Days Post-Op Procedure(s) (LRB): CANNULATED HIP PINNING (Right)    Patient reports pain as mild.  oob in chair and alert.  csm good.  Objective:   VITALS:   Filed Vitals:   02/12/15 1122  BP: 126/50  Pulse: 88  Temp: 98.1 F (36.7 C)  Resp: 14    ABD soft Neurovascular intact Sensation intact distally Intact pulses distally No cellulitis present Compartment soft  LABS  Recent Labs  02/10/15 0527 02/11/15 0333 02/12/15 0309  HGB 9.9* 8.8* 8.8*  HCT 28.9* 25.8* 26.0*  WBC 9.1 10.4 10.1  PLT 296 258 269     Recent Labs  02/10/15 0527 02/11/15 0333 02/12/15 0309  NA 132* 132* 131*  K 3.9 3.9 4.6  BUN 26* 26* 29*  CREATININE 1.54* 1.60* 1.67*  GLUCOSE 93 101* 113*     Recent Labs  02/09/15 1734  INR 1.06     Assessment/Plan: 2 Days Post-Op Procedure(s) (LRB): CANNULATED HIP PINNING (Right)   Up with therapy Discharge to SNF when available

## 2015-02-12 NOTE — Progress Notes (Signed)
Spoke to dr Marcille Blanco via phone concerning low B/P . Dr states to reevaluate b/p before giving am  B/P meds some expiratory wheezes noted.

## 2015-02-12 NOTE — Progress Notes (Signed)
PT NSR ON MONITOR. DR Joaquin Music D/C CARDIAC MONITORING

## 2015-02-12 NOTE — Progress Notes (Signed)
Physical Therapy Treatment Patient Details Name: Jeremiah Bates MRN: [id number] DOB: 03-14-1920 Today's Date: 02/12/2015    History of Present Illness This patient is a 81 year old male who came to [facility] after a fall suffering an R hip fracture and is now recieved an ORIF repair. (s/p R hip ORIF)    PT Comments    Pt again very pleasant and motivated with PT.  He continues to struggle with moving/advancing L foot using UEs to unweight. He is getting minimally better with this but is still not able to attempt more than very short, basic bed<>chair "ambulation"  Follow Up Recommendations  SNF     Equipment Recommendations       Recommendations for Other Services       Precautions / Restrictions Precautions Precautions: Fall Restrictions Weight Bearing Restrictions: Yes RLE Weight Bearing: Touchdown weight bearing    Mobility  Bed Mobility Overal bed mobility: Needs Assistance Bed Mobility: Supine to Sit     Supine to sit: Mod assist;Min assist        Transfers Overall transfer level: Needs assistance Equipment used: Rolling walker (2 wheeled) Transfers: Sit to/from Stand Sit to Stand: Min assist;Mod assist         General transfer comment: able to maintain TTWBing, but pt still needing some assist to get to fully standing  Ambulation/Gait Ambulation/Gait assistance: Min assist Ambulation Distance (Feet): 7 Feet Assistive device: Rolling walker (2 wheeled)       General Gait Details: Pt again shows great effort but is simply limited by upper body strength and arthritic shoulders to do much real ambulation while maitnaining TTWBing.  Pt keeps weight off R well, but struggles to do more than the smallest hops.    Stairs            Wheelchair Mobility    Modified Rankin (Stroke Patients Only)       Balance                                    Cognition Arousal/Alertness: Awake/alert Behavior During Therapy: WFL for tasks  assessed/performed                        Exercises General Exercises - Lower Extremity Ankle Circles/Pumps: AROM;10 reps Quad Sets: AROM;10 reps Short Arc Quad: AROM;10 reps Heel Slides: AROM;10 reps Hip ABduction/ADduction: AROM;10 reps Straight Leg Raises: 10 reps;AAROM    General Comments        Pertinent Vitals/Pain Pain Assessment: 0-10 Pain Score: 5     Home Living                      Prior Function            PT Goals (current goals can now be found in the care plan section) Progress towards PT goals: Progressing toward goals    Frequency  BID    PT Plan Current plan remains appropriate    Co-evaluation             End of Session Equipment Utilized During Treatment: Gait belt Activity Tolerance: Patient tolerated treatment well Patient left: with bed alarm set     Time: 8841-6606 PT Time Calculation (min) (ACUTE ONLY): 28 min  Charges:  $Gait Training: 8-22 mins $Therapeutic Exercise: 8-22 mins  G Codes:     Wayne Both, PT, DPT [phone]  Kreg Shropshire 02/12/2015, 2:56 PM

## 2015-02-12 NOTE — Progress Notes (Signed)
Neuropathy in lower legs

## 2015-02-12 NOTE — Progress Notes (Signed)
[facility] [place] NAME: Jeremiah Bates    MR#:  [id number]  DATE OF BIRTH:  1920/04/27  SUBJECTIVE:  CHIEF COMPLAINT:   Chief Complaint  Patient presents with  . Fall    Patient is a 81 year old Caucasian male with history of hypertension, diastolic CHF, restless leg syndrome who presents  with hip pain. Pain was significant that he was not able even to get up after fall. He was brought to emergency room for further evaluation and was found to have a right hip fracture. Orthopedic surgeon was consulted who felt that patient needs pinning of right subcapital hip fracture. Patient's last Plavix dose was on 02/08/2015.  Underwent percutaneous the right cannulated hip pinning 11 th August , 2016 by Dr. Earnestine Leys, doing well , seen by physical therapist and recommended debilitation placement. Minimal Pain in left hip. Hemoglobin remains stable. Had a bowel movement today 13th of August 2016   Review of Systems  Constitutional: Negative for fever, chills and weight loss.  HENT: Negative for congestion.   Eyes: Negative for blurred vision and double vision.  Respiratory: Negative for cough, sputum production, shortness of breath and wheezing.   Cardiovascular: Negative for chest pain, palpitations, orthopnea, leg swelling and PND.  Gastrointestinal: Negative for nausea, vomiting, abdominal pain, diarrhea, constipation and blood in stool.  Genitourinary: Negative for dysuria, urgency, frequency and hematuria.  Musculoskeletal: Negative for falls.  Neurological: Negative for dizziness, tremors, focal weakness and headaches.  Endo/Heme/Allergies: Does not bruise/bleed easily.  Psychiatric/Behavioral: Negative for depression. The patient does not have insomnia.     VITAL SIGNS: Blood pressure 126/50, pulse 88, temperature 98.1 F (36.7 C), temperature source Oral, resp. rate 14, height 5' 1.5" (1.562 m), weight 69.854 kg (154 lb), SpO2 94  %.  PHYSICAL EXAMINATION:   GENERAL:  81 y.o.-year-old patient lying in the bed with no acute distress. Dry oral mucosa. Edentulous and  comfortable  EYES: Pupils equal, round, reactive to light and accommodation. No scleral icterus. Extraocular muscles intact.  HEENT: Head atraumatic, normocephalic. Oropharynx and nasopharynx clear.  NECK:  Supple, no jugular venous distention. No thyroid enlargement, no tenderness.  LUNGS: Normal breath sounds bilaterally, no wheezing, rales,rhonchi or crepitation. No use of accessory muscles of respiration.  CARDIOVASCULAR: S1, S2 normal. No murmurs, rubs, or gallops.  ABDOMEN: Soft, nontender, nondistended. Bowel sounds present. No organomegaly or mass.  EXTREMITIES: No pedal edema, cyanosis, or clubbing.  mild edema in the right hip area, which is dressed after operation. No bleeding, discharge or significant tenderness on palpation  NEUROLOGIC: Cranial nerves II through XII are intact. Muscle strength 5/5 in all extremities. Sensation intact. Gait not checked.  PSYCHIATRIC: The patient is alert and oriented x 3.  SKIN: No obvious rash, lesion, or ulcer.   ORDERS/RESULTS REVIEWED:   CBC  Recent Labs Lab 02/09/15 1051 02/09/15 1734 02/10/15 0527 02/11/15 0333 02/12/15 0309  WBC 8.6 9.4 9.1 10.4 10.1  HGB 10.2* 11.2* 9.9* 8.8* 8.8*  HCT 29.9* 33.8* 28.9* 25.8* 26.0*  PLT 304 342 296 258 269  MCV 94.0 94.9 94.8 95.1 94.9  MCH 31.9 31.4 32.6 32.3 32.3  MCHC 33.9 33.1 34.4 34.0 34.1  RDW 14.2 14.3 14.5 14.3 14.3  LYMPHSABS 0.9*  --   --   --   --   MONOABS 0.6  --   --   --   --   EOSABS 0.0  --   --   --   --  BASOSABS 0.0  --   --   --   --    ------------------------------------------------------------------------------------------------------------------  Chemistries   Recent Labs Lab 02/09/15 1051 02/09/15 1734 02/10/15 0527 02/11/15 0333 02/12/15 0309  NA 131*  --  132* 132* 131*  K 4.2  --  3.9 3.9 4.6  CL 96*  --  98* 101  99*  CO2 27  --  25 26 27   GLUCOSE 115*  --  93 101* 113*  BUN 26*  --  26* 26* 29*  CREATININE 1.55* 1.57* 1.54* 1.60* 1.67*  CALCIUM 8.8*  --  8.5* 8.0* 8.2*  AST 28  --   --   --   --   ALT 15*  --   --   --   --   ALKPHOS 58  --   --   --   --   BILITOT 0.4  --   --   --   --    ------------------------------------------------------------------------------------------------------------------ estimated creatinine clearance is 23.5 mL/min (by C-G formula based on Cr of 1.67). ------------------------------------------------------------------------------------------------------------------  Recent Labs  02/11/15 0527  TSH 1.511    Cardiac Enzymes  Recent Labs Lab 02/09/15 1051  TROPONINI <0.03   ------------------------------------------------------------------------------------------------------------------ Invalid input(s): POCBNP ---------------------------------------------------------------------------------------------------------------  RADIOLOGY: Dg Hip Operative Unilat With Pelvis Right  02/10/2015   CLINICAL DATA:  Right hip fracture with pinning  EXAM: OPERATIVE right HIP (WITH PELVIS IF PERFORMED) 2 VIEWS  TECHNIQUE: Fluoroscopic spot image(s) were submitted for interpretation post-operatively.  COMPARISON:  Pelvis a right hip films of 02/09/2015  FINDINGS: Four nails have been placed for fixation of the subcapital right femoral fracture. No complicating features are seen.  IMPRESSION: Pinning of right subcapital femoral fracture.   Electronically Signed   By: Ivar Drape M.D.   On: 02/10/2015 12:21    EKG:  Orders placed or performed in visit on 02/09/15  . EKG 12-Lead  . EKG 12-Lead  . EKG 12-Lead    ASSESSMENT AND PLAN:  Active Problems:   Hip fracture   Femur fracture   Chronic diastolic CHF (congestive heart failure)   Chronic anemia   History of stroke   Encounter for anticoagulation discussion and counseling   Preoperative cardiovascular  examination 1. Right subcapital  hip fracture s/p  percutaneous right cannulated hip pinning 11th of August 2016 by Dr. Earnestine Leys. Etiology remains unclear, fall ? or is it pathologic fracture. Pathology is pending. Physical therapy recommended rehabilitation, care management is involved 2. Renal insufficiency, likely chronic stable 3. Hyponatremia, thought to be related to Lasix. , However, did not improve with holding Lasix and initiating IV fluids , get urine osmolarity to rule out SIADH.  TSH is normal  4. Anemia.  relatively stable after operative therapy 5. Vitamin D deficiency. Started calcium with vitamin D  6 . Constipation. Continue Colace and senna, last bowel movement on 02/12/2015   Management plans discussed with the patient,  patient's son  DRUG ALLERGIES: No Known Allergies  CODE STATUS:     Code Status Orders        Start     Ordered   02/09/15 1529  Do not attempt resuscitation (DNR)   Continuous    Question Answer Comment  In the event of cardiac or respiratory ARREST Do not call a "code blue"   In the event of cardiac or respiratory ARREST Do not perform Intubation, CPR, defibrillation or ACLS   In the event of cardiac or respiratory ARREST Use medication by any route,  position, wound care, and other measures to relive pain and suffering. May use oxygen, suction and manual treatment of airway obstruction as needed for comfort.      02/09/15 1528    Advance Directive Documentation        Most Recent Value   Type of Advance Directive  Living will   Pre-existing out of facility DNR order (yellow form or pink MOST form)     "MOST" Form in Place?        TOTAL TIME TAKING CARE OF THIS PATIENT: 35  minutes.    Theodoro Grist M.D on 02/12/2015 at 12:02 PM  Between 7am to 6pm - Pager - [phone]  After 6pm go to [email or web address] - password [email or web address] [facility]  Office  [phone]  CC: Primary care physician; Dicky Doe, MD

## 2015-02-12 NOTE — Progress Notes (Signed)
Spoke with dr Marcille Blanco concerning low B/P . Said to wait until this am to reassess before giving B/P meds. Some expiratory wheezes noted.

## 2015-02-12 NOTE — Progress Notes (Signed)
Physical Therapy Treatment Patient Details Name: Jeremiah Bates MRN: [id number] DOB: 1920/05/17 Today's Date: 02/12/2015    History of Present Illness This patient is a 80 year old male who came to [facility] after a fall suffering an R hip fracture and is now recieved an ORIF repair. (s/p R hip ORIF)    PT Comments    Pt continues to be very pleasant during PT and eager to work hard.  He shows very good effort with exercises, but struggles with standing/walking acts.  Statically he maintains R NWBing well, but he struggles to use UEs on walker well enough to make effective hopping steps.  He is able to do small hops more consistently today (as opposed to heel-toe shifting, etc) but is still very limited with ambulation/mobility.   Follow Up Recommendations  SNF     Equipment Recommendations       Recommendations for Other Services       Precautions / Restrictions Precautions Precautions: Fall Restrictions Weight Bearing Restrictions: Yes RLE Weight Bearing: Touchdown weight bearing    Mobility  Bed Mobility Overal bed mobility: Needs Assistance Bed Mobility: Sit to Supine     Supine to sit: Mod assist        Transfers Overall transfer level: Needs assistance Equipment used: Rolling walker (2 wheeled) Transfers: Sit to/from Stand Sit to Stand: Mod assist         General transfer comment: again pt does surprisingly well with maintaing TTWB  Ambulation/Gait Ambulation/Gait assistance: Mod assist   Assistive device: Rolling walker (2 wheeled)       General Gait Details: Pt better able to try some "hopping" but is still very limited with how much he is able to fully unweight with UEs. Pt is making slow, but consistent gains each session.    Stairs            Wheelchair Mobility    Modified Rankin (Stroke Patients Only)       Balance                                    Cognition Arousal/Alertness: Awake/alert Behavior During  Therapy: WFL for tasks assessed/performed                        Exercises General Exercises - Lower Extremity Ankle Circles/Pumps: AROM;10 reps Quad Sets: AROM;10 reps Short Arc Quad: AROM;10 reps Heel Slides: AROM;10 reps Hip ABduction/ADduction: AROM;10 reps Straight Leg Raises: 10 reps;AAROM    General Comments        Pertinent Vitals/Pain Pain Assessment: 0-10 Pain Score: 5     Home Living                      Prior Function            PT Goals (current goals can now be found in the care plan section) Progress towards PT goals: Progressing toward goals    Frequency  BID    PT Plan Current plan remains appropriate    Co-evaluation             End of Session Equipment Utilized During Treatment: Gait belt Activity Tolerance: Patient tolerated treatment well Patient left: with bed alarm set     Time: 8270-7867 PT Time Calculation (min) (ACUTE ONLY): 25 min  Charges:  $Gait Training: 8-22 mins $Therapeutic Exercise: 8-22 mins  G Codes:     Wayne Both, PT, DPT [phone]  Kreg Shropshire 02/12/2015, 11:31 AM

## 2015-02-12 NOTE — Clinical Social Work Placement (Signed)
   CLINICAL SOCIAL WORK PLACEMENT  NOTE  Date:  02/12/2015  Patient Details  Name: Jeremiah Bates MRN: [id number] Date of Birth: [birth year]  Clinical Social Work is seeking post-discharge placement for this patient at the [place] level of care (*CSW will initial, date and re-position this form in  chart as items are completed):  Yes   Patient/family provided with [facility] Work Department's list of facilities offering this level of care within the geographic area requested by the patient (or if unable, by the patient's family).  Yes   Patient/family informed of their freedom to choose among providers that offer the needed level of care, that participate in Medicare, Medicaid or managed care program needed by the patient, have an available bed and are willing to accept the patient.  Yes   Patient/family informed of [facility]'s ownership interest in [facility] and [facility], as well as of the fact that they are under no obligation to receive care at these facilities.  PASRR submitted to EDS on 02/12/15     PASRR number received on 02/12/15     Existing PASRR number confirmed on       FL2 transmitted to all facilities in geographic area requested by pt/family on 02/12/15     FL2 transmitted to all facilities within larger geographic area on 02/12/15     Patient informed that his/her managed care company has contracts with or will negotiate with certain facilities, including the following:            Patient/family informed of bed offers received.  Patient chooses bed at       Physician recommends and patient chooses bed at      Patient to be transferred to   on  .  Patient to be transferred to facility by       Patient family notified on   of transfer.  Name of family member notified:        PHYSICIAN Please sign FL2, Please sign DNR, Please prepare priority discharge summary, including medications     Additional Comment:     _______________________________________________ Alonna Buckler, LCSW 02/12/2015, 2:06 PM
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
## 2015-02-13 LAB — CBC
HCT: 25.1 % — ABNORMAL LOW (ref 40.0–52.0)
HEMOGLOBIN: 8.4 g/dL — AB (ref 13.0–18.0)
MCH: 32.2 pg (ref 26.0–34.0)
MCHC: 33.6 g/dL (ref 32.0–36.0)
MCV: 95.8 fL (ref 80.0–100.0)
PLATELETS: 254 10*3/uL (ref 150–440)
RBC: 2.62 MIL/uL — AB (ref 4.40–5.90)
RDW: 14.3 % (ref 11.5–14.5)
WBC: 9.7 10*3/uL (ref 3.8–10.6)

## 2015-02-13 LAB — OSMOLALITY, URINE: Osmolality, Ur: 463 mOsm/kg (ref 300–900)

## 2015-02-13 MED ORDER — BUDESONIDE-FORMOTEROL FUMARATE 160-4.5 MCG/ACT IN AERO
2.0000 | INHALATION_SPRAY | Freq: Two times a day (BID) | RESPIRATORY_TRACT | Status: DC
  Administered 2015-02-13 – 2015-02-15 (×5): 2 via RESPIRATORY_TRACT
  Filled 2015-02-13: qty 6

## 2015-02-13 NOTE — Progress Notes (Signed)
[facility] NAME: Jeremiah Bates    MR#:  [id number]  DATE OF BIRTH:  1919/08/23  SUBJECTIVE:  CHIEF COMPLAINT:   Chief Complaint  Patient presents with  . Fall    Patient is a 81 year old Caucasian male with history of hypertension, diastolic CHF, restless leg syndrome who presents  with hip pain. Pain was significant that he was not able even to get up after fall. He was brought to emergency room for further evaluation and was found to have a right hip fracture. Orthopedic surgeon was consulted who felt that patient needs pinning of right subcapital hip fracture. Patient's last Plavix dose was on 02/08/2015.  Underwent percutaneous the right cannulated hip pinning 11 th August , 2016 by Dr. Earnestine Leys, doing well , seen by physical therapist and recommended debilitation placement. Minimal Pain in left hip. Hemoglobin remains stable. Had a bowel movement on the 13th of August 2016 Feels good today. Hopes to get to rehabilitation facility  Tomorrow, denies any significant pain, however, instead of sitting in the chair   Review of Systems  Constitutional: Negative for fever, chills and weight loss.  HENT: Negative for congestion.   Eyes: Negative for blurred vision and double vision.  Respiratory: Negative for cough, sputum production, shortness of breath and wheezing.   Cardiovascular: Negative for chest pain, palpitations, orthopnea, leg swelling and PND.  Gastrointestinal: Negative for nausea, vomiting, abdominal pain, diarrhea, constipation and blood in stool.  Genitourinary: Negative for dysuria, urgency, frequency and hematuria.  Musculoskeletal: Negative for falls.  Neurological: Negative for dizziness, tremors, focal weakness and headaches.  Endo/Heme/Allergies: Does not bruise/bleed easily.  Psychiatric/Behavioral: Negative for depression. The patient does not have insomnia.     VITAL SIGNS: Blood pressure 133/58,  pulse 90, temperature 98.7 F (37.1 C), temperature source Oral, resp. rate 14, height 5' 1.5" (1.562 m), weight 69.854 kg (154 lb), SpO2 94 %.  PHYSICAL EXAMINATION:   GENERAL:  81 y.o.-year-old patient lying in the bed with no acute distress. Dry oral mucosa. Edentulous and  comfortable  EYES: Pupils equal, round, reactive to light and accommodation. No scleral icterus. Extraocular muscles intact.  HEENT: Head atraumatic, normocephalic. Oropharynx and nasopharynx clear.  NECK:  Supple, no jugular venous distention. No thyroid enlargement, no tenderness.  LUNGS: Normal breath sounds bilaterally, no wheezing, rales,rhonchi or crepitation. No use of accessory muscles of respiration.  CARDIOVASCULAR: S1, S2 normal. No murmurs, rubs, or gallops.  ABDOMEN: Soft, nontender, nondistended. Bowel sounds present. No organomegaly or mass.  EXTREMITIES: No pedal edema, cyanosis, or clubbing.  mild edema in the right hip area, which is dressed after operation. No bleeding, discharge or significant tenderness on palpation  NEUROLOGIC: Cranial nerves II through XII are intact. Muscle strength 5/5 in all extremities. Sensation intact. Gait not checked.  PSYCHIATRIC: The patient is alert and oriented x 3.  SKIN: No obvious rash, lesion, or ulcer.   ORDERS/RESULTS REVIEWED:   CBC  Recent Labs Lab 02/09/15 1051 02/09/15 1734 02/10/15 0527 02/11/15 0333 02/12/15 0309 02/13/15 0350  WBC 8.6 9.4 9.1 10.4 10.1 9.7  HGB 10.2* 11.2* 9.9* 8.8* 8.8* 8.4*  HCT 29.9* 33.8* 28.9* 25.8* 26.0* 25.1*  PLT 304 342 296 258 269 254  MCV 94.0 94.9 94.8 95.1 94.9 95.8  MCH 31.9 31.4 32.6 32.3 32.3 32.2  MCHC 33.9 33.1 34.4 34.0 34.1 33.6  RDW 14.2 14.3 14.5 14.3 14.3 14.3  LYMPHSABS 0.9*  --   --   --   --   --  MONOABS 0.6  --   --   --   --   --   EOSABS 0.0  --   --   --   --   --   BASOSABS 0.0  --   --   --   --   --     ------------------------------------------------------------------------------------------------------------------  Chemistries   Recent Labs Lab 02/09/15 1051 02/09/15 1734 02/10/15 0527 02/11/15 0333 02/12/15 0309  NA 131*  --  132* 132* 131*  K 4.2  --  3.9 3.9 4.6  CL 96*  --  98* 101 99*  CO2 27  --  25 26 27   GLUCOSE 115*  --  93 101* 113*  BUN 26*  --  26* 26* 29*  CREATININE 1.55* 1.57* 1.54* 1.60* 1.67*  CALCIUM 8.8*  --  8.5* 8.0* 8.2*  AST 28  --   --   --   --   ALT 15*  --   --   --   --   ALKPHOS 58  --   --   --   --   BILITOT 0.4  --   --   --   --    ------------------------------------------------------------------------------------------------------------------ estimated creatinine clearance is 23.5 mL/min (by C-G formula based on Cr of 1.67). ------------------------------------------------------------------------------------------------------------------  Recent Labs  02/11/15 0527  TSH 1.511    Cardiac Enzymes  Recent Labs Lab 02/09/15 1051  TROPONINI <0.03   ------------------------------------------------------------------------------------------------------------------ Invalid input(s): POCBNP ---------------------------------------------------------------------------------------------------------------  RADIOLOGY: No results found.  EKG:  Orders placed or performed in visit on 02/09/15  . EKG 12-Lead  . EKG 12-Lead  . EKG 12-Lead    ASSESSMENT AND PLAN:  Active Problems:   Hip fracture   Femur fracture   Chronic diastolic CHF (congestive heart failure)   Chronic anemia   History of stroke   Encounter for anticoagulation discussion and counseling   Preoperative cardiovascular examination 1. Right subcapital  hip fracture s/p  percutaneous right cannulated hip pinning 11th of August 2016 by Dr. Earnestine Leys. Etiology remains unclear, fall ? or is it pathologic fracture. ?Pathology . Physical therapy recommended  rehabilitation, care management is involved, likely discharge to rehabilitation facility tomorrow on Monday 2. Renal insufficiency, likely chronic stable 3. Hyponatremia, thought to be related to Lasix. , However, did not improve with holding Lasix and initiating IV fluids , get urine osmolarity to rule out SIADH STAT.  TSH is normal  4. Anemia.  relatively stable after operative therapy 5. Vitamin D deficiency. Started calcium with vitamin D  6 . Constipation. Continue Colace and senna, last bowel movement on 02/12/2015   Management plans discussed with the patient,  patient's son  DRUG ALLERGIES: No Known Allergies  CODE STATUS:     Code Status Orders        Start     Ordered   02/09/15 1529  Do not attempt resuscitation (DNR)   Continuous    Question Answer Comment  In the event of cardiac or respiratory ARREST Do not call a "code blue"   In the event of cardiac or respiratory ARREST Do not perform Intubation, CPR, defibrillation or ACLS   In the event of cardiac or respiratory ARREST Use medication by any route, position, wound care, and other measures to relive pain and suffering. May use oxygen, suction and manual treatment of airway obstruction as needed for comfort.      02/09/15 1528    Advance Directive Documentation        Most  Recent Value   Type of Advance Directive  Living will   Pre-existing out of facility DNR order (yellow form or pink MOST form)     "MOST" Form in Place?        TOTAL TIME TAKING CARE OF THIS PATIENT: 30 minutes.   DC planning Merwin Breden M.D on 02/13/2015 at 1:12 PM  Between 7am to 6pm - Pager - [phone]  After 6pm go to [email or web address] - password [email or web address] [facility]  Office  [phone]  CC: Primary care physician; Dicky Doe, MD

## 2015-02-13 NOTE — Progress Notes (Signed)
Subjective: 3 Days Post-Op Procedure(s) (LRB): CANNULATED HIP PINNING (Right)    Patient reports pain as mild.  Objective:   VITALS:   Filed Vitals:   02/13/15 1125  BP: 133/58  Pulse: 90  Temp:   Resp:     Neurovascular intact Sensation intact distally Intact pulses distally Dorsiflexion/Plantar flexion intact No cellulitis present Compartment soft  LABS  Recent Labs  02/11/15 0333 02/12/15 0309 02/13/15 0350  HGB 8.8* 8.8* 8.4*  HCT 25.8* 26.0* 25.1*  WBC 10.4 10.1 9.7  PLT 258 269 254     Recent Labs  02/11/15 0333 02/12/15 0309  NA 132* 131*  K 3.9 4.6  BUN 26* 29*  CREATININE 1.60* 1.67*  GLUCOSE 101* 113*    No results for input(s): LABPT, INR in the last 72 hours.   Assessment/Plan: 3 Days Post-Op Procedure(s) (LRB): CANNULATED HIP PINNING (Right)   Up with therapy Discharge to SNF when available

## 2015-02-13 NOTE — Progress Notes (Signed)
Physical Therapy Treatment Patient Details Name: Jeremiah Bates MRN: [id number] DOB: [birth year] Today's Date: 02/13/2015    History of Present Illness This patient is a 81 year old male who came to [facility] after a fall suffering an R hip fracture and is now recieved an ORIF repair. (s/p R hip ORIF)    PT Comments    Pt continues to be very pleasant and motivated with PT but fatigues very quickly with his labored R TTWBing ambulation but ultimately is not very functional with "ambulation."  He is showing increased strength with quad and hip flexors and is not pain limited.   Follow Up Recommendations  SNF     Equipment Recommendations       Recommendations for Other Services       Precautions / Restrictions Precautions Precautions: Fall Restrictions Weight Bearing Restrictions: Yes RLE Weight Bearing: Touchdown weight bearing    Mobility  Bed Mobility Overal bed mobility: Needs Assistance Bed Mobility: Supine to Sit     Supine to sit: Mod assist;Min assist        Transfers Overall transfer level: Needs assistance Equipment used: Rolling walker (2 wheeled) Transfers: Sit to/from Stand Sit to Stand: Min assist;Mod assist         General transfer comment: able to maintain TTWBing, but pt still needing assist to get to full standing  Ambulation/Gait Ambulation/Gait assistance: Mod assist Ambulation Distance (Feet): 6 Feet Assistive device: Rolling walker (2 wheeled)       General Gait Details: Pt again shows great effort but is simply limited by upper body strength and arthritic shoulders to do much real ambulation while maitnaining TTWBing.  Pt keeps weight off R well, but struggles to do more than the smallest hops as he fatigues quickly and needs assist to get back to the recliner safety   Stairs            Wheelchair Mobility    Modified Rankin (Stroke Patients Only)       Balance                                    Cognition  Arousal/Alertness: Awake/alert Behavior During Therapy: WFL for tasks assessed/performed                        Exercises General Exercises - Lower Extremity Ankle Circles/Pumps: AROM;10 reps Quad Sets: AROM;10 reps Short Arc Quad: AROM;10 reps Heel Slides: AROM;10 reps Hip ABduction/ADduction: AROM;10 reps Straight Leg Raises: 10 reps;AAROM    General Comments        Pertinent Vitals/Pain Pain Assessment:  (pt does not rate, indicates only minimal pain)    Home Living                      Prior Function            PT Goals (current goals can now be found in the care plan section) Progress towards PT goals: Progressing toward goals    Frequency  BID    PT Plan Current plan remains appropriate    Co-evaluation             End of Session Equipment Utilized During Treatment: Gait belt Activity Tolerance: Patient tolerated treatment well Patient left: with bed alarm set     Time: 0240-9735 PT Time Calculation (min) (ACUTE ONLY): 26 min  Charges:  $Gait Training:  8-22 mins $Therapeutic Exercise: 8-22 mins                    G Codes:     Wayne Both, [place], DPT [phone]  Kreg Shropshire 02/13/2015, 12:21 PM

## 2015-02-14 DIAGNOSIS — N189 Chronic kidney disease, unspecified: Secondary | ICD-10-CM

## 2015-02-14 DIAGNOSIS — E559 Vitamin D deficiency, unspecified: Secondary | ICD-10-CM

## 2015-02-14 DIAGNOSIS — K5900 Constipation, unspecified: Secondary | ICD-10-CM

## 2015-02-14 DIAGNOSIS — E871 Hypo-osmolality and hyponatremia: Secondary | ICD-10-CM

## 2015-02-14 LAB — SODIUM: Sodium: 130 mmol/L — ABNORMAL LOW (ref 135–145)

## 2015-02-14 MED ORDER — METHOCARBAMOL 500 MG PO TABS: 500.0000 mg | ORAL_TABLET | Freq: Four times a day (QID) | ORAL | Status: AC | PRN

## 2015-02-14 MED ORDER — BISACODYL 10 MG RE SUPP: 10.0000 mg | Freq: Every day | RECTAL | Status: AC | PRN

## 2015-02-14 MED ORDER — ASPIRIN EC 325 MG PO TBEC
325.0000 mg | DELAYED_RELEASE_TABLET | Freq: Every day | ORAL | Status: DC
  Administered 2015-02-14 – 2015-02-15 (×2): 325 mg via ORAL
  Filled 2015-02-14 (×2): qty 1

## 2015-02-14 MED ORDER — HYDROCODONE-ACETAMINOPHEN 5-325 MG PO TABS: 1.0000 | ORAL_TABLET | ORAL | Status: AC | PRN

## 2015-02-14 MED ORDER — SENNOSIDES-DOCUSATE SODIUM 8.6-50 MG PO TABS: 1.0000 | ORAL_TABLET | Freq: Every evening | ORAL | Status: AC | PRN

## 2015-02-14 MED ORDER — MINERAL OIL RE ENEM
1.0000 | ENEMA | Freq: Once | RECTAL | Status: DC
Start: 2015-02-14 — End: 2015-02-14

## 2015-02-14 MED ORDER — ACETAMINOPHEN 325 MG PO TABS: 650.0000 mg | ORAL_TABLET | Freq: Four times a day (QID) | ORAL | Status: AC | PRN

## 2015-02-14 MED ORDER — BUDESONIDE-FORMOTEROL FUMARATE 160-4.5 MCG/ACT IN AERO: 2.0000 | INHALATION_SPRAY | Freq: Two times a day (BID) | RESPIRATORY_TRACT | Status: AC

## 2015-02-14 NOTE — Progress Notes (Signed)
Subjective: 4 Days Post-Op Procedure(s) (LRB): CANNULATED HIP PINNING (Right)     Patient reports pain as mild.  Objective:   VITALS:   Filed Vitals:   02/14/15 0936  BP:   Pulse: 92  Temp:   Resp:     Neurovascular intact Sensation intact distally Intact pulses distally Dorsiflexion/Plantar flexion intact No cellulitis present  LABS  Recent Labs  02/12/15 0309 02/13/15 0350  HGB 8.8* 8.4*  HCT 26.0* 25.1*  WBC 10.1 9.7  PLT 269 254     Recent Labs  02/12/15 0309 02/14/15 1133  NA 131* 130*  K 4.6  --   BUN 29*  --   CREATININE 1.67*  --   GLUCOSE 113*  --     No results for input(s): LABPT, INR in the last 72 hours.   Assessment/Plan: 4 Days Post-Op Procedure(s) (LRB): CANNULATED HIP PINNING (Right)   Discharge to SNF  RTC 10 days

## 2015-02-14 NOTE — Progress Notes (Signed)
CSW confirmed that Pt's authorization request is still in clinical review and Pt will not be able to admit to SNF until auth is received, likely in the morning. CSW updated son and Therapist, sports. CSW will follow up in the morning.   Toma Copier, [place]

## 2015-02-14 NOTE — Progress Notes (Signed)
Occupational Therapy Treatment Patient Details Name: Jeremiah Bates MRN: [id number] DOB: 1919/09/07 Today's Date: 02/14/2015    History of present illness This patient is a 81 year old male who came to [facility] after a fall suffering an R hip fracture and is now recieved an ORIF repair.   OT comments  Pt sleepy and having difficulty arousing and mouth breathing.  Discussed status with son visiting from [place].  Pt is supposed to be discharging to [facility] today but son is not sure.  Reviewed recommended assistive devices for pt and reviewed where to order.  Rec shower chair where pt is going to discharge to to prevent falls. Rec son relay information to therapists at [facility] about pt not breathing through mouth especially during exercise.  Will continue to work on ADL training until pt is discharged to [facility].      Follow Up Recommendations  SNF    Equipment Recommendations       Recommendations for Other Services      Precautions / Restrictions Precautions Precautions: Fall Restrictions Weight Bearing Restrictions: Yes RLE Weight Bearing: Touchdown weight bearing       Mobility Bed Mobility                  Transfers                      Balance                                   ADL                                         General ADL Comments: Educated son from [place] about assistive devices and where to order and rec for bathroom modifications if pt goes to [place] esp      Vision                     Perception     Praxis      Cognition   Behavior During Therapy: [facility] for tasks assessed/performed                         Extremity/Trunk Assessment               Exercises     Shoulder Instructions       General Comments      Pertinent Vitals/ Pain       Pain Assessment: No/denies pain Pain Score: 4  Pain Location: RLE Pain Intervention(s): Premedicated before session;Limited  activity within patient's tolerance  Home Living Family/patient expects to be discharged to:: Skilled nursing facility Living Arrangements: Spouse/significant other                               Additional Comments: May go to [place] with children      Prior Functioning/Environment              Frequency Min 1X/week     Progress Toward Goals  OT Goals(current goals can now be found in the care plan section)  Progress towards OT goals: Progressing toward goals  Acute Rehab OT Goals Patient Stated Goal: "I would like to try to go home." Potential  to Achieve Goals: Good  Plan Discharge plan remains appropriate    Co-evaluation                 End of Session Equipment Utilized During Treatment:  (ADL catalog, reacher and sock aid)   Activity Tolerance Patient limited by fatigue   Patient Left in bed;with call bell/phone within reach;with chair alarm set   Nurse Communication          Time: 1525-1600 OT Time Calculation (min): 35 min  Charges: OT General Charges $OT Visit: 1 Procedure OT Treatments $Self Care/Home Management : 23-37 mins  Marte Celani 02/14/2015, 4:04 PM    Chrys Racer, OTR/L

## 2015-02-14 NOTE — Discharge Summary (Addendum)
[facility] NAME: Jeremiah Bates    MR#:  [id number]  DATE OF BIRTH:  01-27-1920  DATE OF ADMISSION:  02/09/2015 ADMITTING PHYSICIAN: Earnestine Leys, MD  DATE OF DISCHARGE: 8.16.16.   PRIMARY CARE PHYSICIAN: Dicky Doe, MD     ADMISSION DIAGNOSIS:  Femur fracture, right, closed, initial encounter [S72.91XA]  DISCHARGE DIAGNOSIS:  Principal Problem:   Hip fracture Active Problems:   Femur fracture   Chronic anemia   Chronic renal insufficiency   Hyponatremia   Vitamin D deficiency   Chronic diastolic CHF (congestive heart failure)   History of stroke   Encounter for anticoagulation discussion and counseling   Preoperative cardiovascular examination   Constipation   SECONDARY DIAGNOSIS:   Past Medical History  Diagnosis Date  . Anemia   . Restless leg syndrome   . Seasonal allergies   . CKD (chronic kidney disease), stage III   . Hypertension   . Neuropathy   . Prostate cancer   . Chronic diastolic CHF (congestive heart failure)     a. echo 10/2012: EF 55-60%, mild LVH, nl WM, GR1DD, mild AI, PASP nl  . Gastritis   . Stroke 2014    a. on Plavix by neurology  . Hypothyroid   . Closed right hip fracture 01/2015    .pro HOSPITAL COURSE:   Patient is 81 year old Caucasian male with past medical history significant for history of essential hypertension, diastolic CHF, restless leg syndrome who presents with hip pain. Pain. It was so severe that it thought him to the knees and he was not able to get up. He was found to be on the ground. On arrival at the emergency room he was noted to have minimally displaced right femoral subcapital fracture. Dr. Earnestine Leys was consulted and patient underwent percutaneous right hip pinning. Clinical osteoporosis was weak bone of the proximal femur was found during operation. Postoperatively, patient did well, worked with physical therapy and was recommended skilled  nursing facility placement, where he will be discharged today on 02/15/2015 after insurance company authorization.  Discussion by problem 1. Right subcapital hip fracture s/p percutaneous right cannulated hip pinning 11th of August 2016 by Dr. Earnestine Leys. Etiology is likely osteoporosis . Physical therapy recommended rehabilitation, care management is involved, likely discharge to rehabilitation facility today. Continue aspirin therapy to prevent deep vein thrombosis per orthopedist surgeon 2. Renal insufficiency, likely chronic stable 3. Hyponatremia, thought to be related to Lasix , However, did not improve with holding Lasix and initiating IV fluids , urine osmolarity was noted to be high, likely SIADH, continue 1500 cc over 24 hour fluid restriction. TSH is normal . Follow sodium level as outpatient 4. Anemia. relatively stable after operative therapy, follow up as outpatient 5. Vitamin D deficiency. Continue calcium with vitamin D  6 . Constipation. Continue Colace and senna, last bowel movement on 02/14/2015, after enema per patient's request DISCHARGE CONDITIONS:   Fair  CONSULTS OBTAINED:     DRUG ALLERGIES:  No Known Allergies  DISCHARGE MEDICATIONS:   Current Discharge Medication List    START taking these medications   Details  acetaminophen (TYLENOL) 325 MG tablet Take 2 tablets (650 mg total) by mouth every 6 (six) hours as needed for mild pain (or Fever >/= 101). Qty: 60 tablet, Refills: 2    bisacodyl (DULCOLAX) 10 MG suppository Place 1 suppository (10 mg total) rectally daily as needed for moderate constipation. Qty: 12 suppository, Refills: 0  budesonide-formoterol (SYMBICORT) 160-4.5 MCG/ACT inhaler Inhale 2 puffs into the lungs 2 (two) times daily. Qty: 1 Inhaler, Refills: 12    HYDROcodone-acetaminophen (NORCO/VICODIN) 5-325 MG per tablet Take 1-2 tablets by mouth every 4 (four) hours as needed for moderate pain. Qty: 30 tablet, Refills: 0     methocarbamol (ROBAXIN) 500 MG tablet Take 1 tablet (500 mg total) by mouth every 6 (six) hours as needed for muscle spasms. Qty: 40 tablet, Refills: 2    senna-docusate (SENOKOT-S) 8.6-50 MG per tablet Take 1 tablet by mouth at bedtime as needed for mild constipation. Qty: 30 tablet, Refills: 3      CONTINUE these medications which have NOT CHANGED   Details  albuterol (PROVENTIL HFA;VENTOLIN HFA) 108 (90 BASE) MCG/ACT inhaler Inhale 2 puffs into the lungs every 6 (six) hours as needed for wheezing.    Calcium Carb-Cholecalciferol (CALCIUM 500 +D) 500-400 MG-UNIT TABS Take 1 tablet by mouth daily.    clonazePAM (KLONOPIN) 0.5 MG tablet Take 0.5 mg by mouth at bedtime.    clopidogrel (PLAVIX) 75 MG tablet Take 75 mg by mouth daily.    ferrous sulfate 325 (65 FE) MG tablet Take 325 mg by mouth 3 (three) times daily with meals.    furosemide (LASIX) 20 MG tablet Take 1 tablet (20 mg total) by mouth daily as needed. Qty: 30 tablet, Refills: 6    levothyroxine (SYNTHROID, LEVOTHROID) 25 MCG tablet Take 25 mcg by mouth daily before breakfast.    loratadine (CLARITIN) 10 MG tablet Take 10 mg by mouth at bedtime.    losartan (COZAAR) 50 MG tablet Take 1 tablet (50 mg total) by mouth daily. Qty: 30 tablet, Refills: 3    Omega-3 Fatty Acids (FISH OIL) 1200 MG CAPS Take 1 capsule by mouth daily.    pantoprazole (PROTONIX) 20 MG tablet Take 20 mg by mouth daily.    pramipexole (MIRAPEX) 1 MG tablet Take 2 mg by mouth at bedtime.    Probiotic Product (PROBIOTIC PO) Take 1 tablet by mouth daily.    simvastatin (ZOCOR) 20 MG tablet Take 20 mg by mouth daily at 6 PM.     tamsulosin (FLOMAX) 0.4 MG CAPS Take 0.4 mg by mouth daily.    traZODone (DESYREL) 100 MG tablet Take 100 mg by mouth at bedtime.    verapamil (CALAN-SR) 180 MG CR tablet Take 180 mg by mouth daily.     vitamin E 400 UNIT capsule Take 400 Units by mouth daily.         DISCHARGE INSTRUCTIONS:    Follow-up with  primary care physician, Dr. Luan Pulling and Dr. Earnestine Leys in approximately one week after discharge  If you experience worsening of your admission symptoms, develop shortness of breath, life threatening emergency, suicidal or homicidal thoughts you must seek medical attention immediately by calling 911 or calling your MD immediately  if symptoms less severe.  You Must read complete instructions/literature along with all the possible adverse reactions/side effects for all the Medicines you take and that have been prescribed to you. Take any new Medicines after you have completely understood and accept all the possible adverse reactions/side effects.   Please note  You were cared for by a hospitalist during your hospital stay. If you have any questions about your discharge medications or the care you received while you were in the hospital after you are discharged, you can call the unit and asked to speak with the hospitalist on call if the hospitalist that took care of  you is not available. Once you are discharged, your primary care physician will handle any further medical issues. Please note that NO REFILLS for any discharge medications will be authorized once you are discharged, as it is imperative that you return to your primary care physician (or establish a relationship with a primary care physician if you do not have one) for your aftercare needs so that they can reassess your need for medications and monitor your lab values.    Today   CHIEF COMPLAINT:   Chief Complaint  Patient presents with  . Fall    HISTORY OF PRESENT ILLNESS:  Tino Ronan  is a 81 y.o. male with a known history of essential hypertension, diastolic CHF, restless leg syndrome who presents with hip pain. Pain. It was so severe that it thought him to the knees and he was not able to get up. He was found to be on the ground. On arrival at the emergency room he was noted to have minimally displaced right femoral  subcapital fracture. Dr. Earnestine Leys was consulted and patient underwent percutaneous right hip pinning. Clinical osteoporosis was weak bone of the proximal femur was found during operation. Postoperatively, patient did well, worked with physical therapy and was recommended skilled nursing facility placement, where he will be discharged today on 02/14/2015.  Discussion by problem 1. Right subcapital hip fracture s/p percutaneous right cannulated hip pinning 11th of August 2016 by Dr. Earnestine Leys. Etiology is likely osteoporosis . Physical therapy recommended rehabilitation, care management is involved, discharge to rehabilitation facility today after insurance company authorized at. Continue aspirin therapy per orthopedic surgeon's recommendations 2. Renal insufficiency, likely chronic stable 3. Hyponatremia, thought to be related to Lasix , However, did not improve with holding Lasix and initiating IV fluids , urine osmolarity is high, likely SIADH, started fluid restriction. TSH is normal. Follow sodium level as outpatient  4. Anemia. relatively stable after operative therapy. Follow hemoglobin level as outpatient 5. Vitamin D deficiency. Continue calcium with vitamin D  6 . Constipation. Continue Colace and senna, last bowel movement on 02/14/2015, status post enema yesterday per patient's request   VITAL SIGNS:  Blood pressure 146/56, pulse 84, temperature 98.3 F (36.8 C), temperature source Oral, resp. rate 18, height 5' 1.5" (1.562 m), weight 69.854 kg (154 lb), SpO2 97 %.  I/O:   Intake/Output Summary (Last 24 hours) at 02/14/15 0917 Last data filed at 02/14/15 0838  Gross per 24 hour  Intake    617 ml  Output   1750 ml  Net  -1133 ml    PHYSICAL EXAMINATION:  GENERAL:  81 y.o.-year-old patient lying in the bed with no acute distress.  EYES: Pupils equal, round, reactive to light and accommodation. No scleral icterus. Extraocular muscles intact.  HEENT: Head atraumatic,  normocephalic. Oropharynx and nasopharynx clear.  NECK:  Supple, no jugular venous distention. No thyroid enlargement, no tenderness.  LUNGS: Normal breath sounds bilaterally, no wheezing, rales,rhonchi or crepitation. No use of accessory muscles of respiration.  CARDIOVASCULAR: S1, S2 normal. No murmurs, rubs, or gallops.  ABDOMEN: Soft, non-tender, non-distended. Bowel sounds present. No organomegaly or mass.  EXTREMITIES: No pedal edema, cyanosis, or clubbing.  NEUROLOGIC: Cranial nerves II through XII are intact. Muscle strength 5/5 in all extremities. Sensation intact. Gait not checked.  PSYCHIATRIC: The patient is alert and oriented x 3.  SKIN: No obvious rash, lesion, or ulcer.   DATA REVIEW:   CBC  Recent Labs Lab 02/13/15 0350  WBC 9.7  HGB 8.4*  HCT 25.1*  PLT 254    Chemistries   Recent Labs Lab 02/09/15 1051  02/12/15 0309  NA 131*  < > 131*  K 4.2  < > 4.6  CL 96*  < > 99*  CO2 27  < > 27  GLUCOSE 115*  < > 113*  BUN 26*  < > 29*  CREATININE 1.55*  < > 1.67*  CALCIUM 8.8*  < > 8.2*  AST 28  --   --   ALT 15*  --   --   ALKPHOS 58  --   --   BILITOT 0.4  --   --   < > = values in this interval not displayed.  Cardiac Enzymes  Recent Labs Lab 02/09/15 1051  TROPONINI <0.03    Microbiology Results  Results for orders placed or performed during the hospital encounter of 02/09/15  Surgical pcr screen     Status: None   Collection Time: 02/09/15  7:00 PM  Result Value Ref Range Status   MRSA, PCR NEGATIVE NEGATIVE Final   Staphylococcus aureus NEGATIVE NEGATIVE Final    Comment:        The Xpert SA Assay (FDA approved for NASAL specimens in patients over 2 years of age), is one component of a comprehensive surveillance program.  Test performance has been validated by [facility] for patients greater than or equal to 48 year old. It is not intended to diagnose infection nor to guide or monitor treatment.     RADIOLOGY:  No results  found.  EKG:   Orders placed or performed in visit on 02/09/15  . EKG 12-Lead  . EKG 12-Lead  . EKG 12-Lead      Management plans discussed with the patient, family and they are in agreement.  CODE STATUS:     Code Status Orders        Start     Ordered   02/11/15 1350  Do not attempt resuscitation (DNR)   Continuous    Question Answer Comment  In the event of cardiac or respiratory ARREST Do not call a "code blue"   In the event of cardiac or respiratory ARREST Do not perform Intubation, CPR, defibrillation or ACLS   In the event of cardiac or respiratory ARREST Use medication by any route, position, wound care, and other measures to relive pain and suffering. May use oxygen, suction and manual treatment of airway obstruction as needed for comfort.      02/11/15 1350    Advance Directive Documentation        Most Recent Value   Type of Advance Directive  Living will   Pre-existing out of facility DNR order (yellow form or pink MOST form)     "MOST" Form in Place?        TOTAL TIME TAKING CARE OF THIS PATIENT: 40 minutes.    Theodoro Grist M.D on 02/14/2015 at 9:17 AM  Between 7am to 6pm - Pager - [phone]  After 6pm go to [email or web address] - password [email or web address] [facility]  Office  [phone]  CC: Primary care physician; Dicky Doe, MD

## 2015-02-14 NOTE — Progress Notes (Signed)
Physical Therapy Treatment Patient Details Name: TAKUMA CIFELLI MRN: [id number] DOB: 04/03/1920 Today's Date: 02/14/2015    History of Present Illness This patient is a 81 year old male who came [facility] after a fall suffering an R hip fracture and is now recieved an ORIF repair.    PT Comments    Pt compliant with TTWB on R, but difficulty maintaining full stand and balancing. Progressing slowly toward goals. Pt has discharge orders to skilled nursing facility today.   Follow Up Recommendations  SNF     Equipment Recommendations       Recommendations for Other Services       Precautions / Restrictions Precautions Precautions: Fall Restrictions Weight Bearing Restrictions: Yes RLE Weight Bearing: Touchdown weight bearing    Mobility  Bed Mobility Overal bed mobility: Needs Assistance Bed Mobility: Supine to Sit     Supine to sit: Mod assist        Transfers Overall transfer level: Needs assistance Equipment used: Rolling walker (2 wheeled) Transfers: Sit to/from [facility] Sit to Stand: Mod assist Stand pivot transfers: Mod assist       General transfer comment:  (maintain TTWB; needs assist for balance in stand )  Ambulation/Gait                 Stairs            Wheelchair Mobility    Modified Rankin (Stroke Patients Only)       Balance                                    Cognition Arousal/Alertness: Awake/alert Behavior During Therapy: WFL for tasks assessed/performed Overall Cognitive Status: Difficult to assess                      Exercises General Exercises - Lower Extremity Ankle Circles/Pumps: AROM;20 reps;Both;Supine Quad Sets: Strengthening;Both;15 reps;Supine Gluteal Sets: Strengthening;Both;20 reps;Supine Short Arc Quad: AROM;20 reps;Supine;Both Heel Slides: 15 reps;AAROM;Right;Supine (AROM L) Hip ABduction/ADduction: AAROM;Right;15 reps;Supine (AROM L ) Straight Leg  Raises: AAROM;15 reps;Supine;Both    General Comments        Pertinent Vitals/Pain Pain Assessment: 0-10 Pain Score: 4  Pain Location: RLE Pain Intervention(s): Premedicated before session;Limited activity within patient's tolerance    Home Living                      Prior Function            PT Goals (current goals can now be found in the care plan section) Progress towards PT goals: Progressing toward goals    Frequency  BID    PT Plan Current plan remains appropriate    Co-evaluation             End of Session Equipment Utilized During Treatment: Gait belt Activity Tolerance: Patient tolerated treatment well;No increased pain Patient left: in chair;with call bell/phone within reach (Nsg asst getting chair alarm; pad in place.)     Time: 3704-8889 PT Time Calculation (min) (ACUTE ONLY): 33 min  Charges:  $Therapeutic Exercise: 8-22 mins $Therapeutic Activity: 8-22 mins                    G Codes:      Charlaine Dalton 02/14/2015, 11:36 AM

## 2015-02-14 NOTE — Discharge Instructions (Signed)
Restrict fluids at 1500 cc over 24 hours

## 2015-02-14 NOTE — Progress Notes (Signed)
Late entry 02/13/15 for 02/14/15  CSW followed up with Pt's son who has selected 3 facilities, 2 of which are in [place]. CSW will not be able to confirm SNF bed offers until Monday. Insurance authorization will have to be confirmed on Monday as well. Pt and Pt's son are aware of barrier to dc.    CSW will continue to follow up on Monday and update tx team with progress.  Toma Copier, [place]

## 2015-02-15 MED ORDER — ASPIRIN 325 MG PO TBEC: 325.0000 mg | DELAYED_RELEASE_TABLET | Freq: Every day | ORAL | Status: AC

## 2015-02-15 NOTE — Clinical Social Work Placement (Signed)
   CLINICAL SOCIAL WORK PLACEMENT  NOTE  Date:  02/15/2015  Patient Details  Name: Jeremiah Bates MRN: [id number] Date of Birth: 06-Feb-1920  Clinical Social Work is seeking post-discharge placement for this patient at the [place] level of care (*CSW will initial, date and re-position this form in  chart as items are completed):  Yes   Patient/family provided with [place] Work Department's list of facilities offering this level of care within the geographic area requested by the patient (or if unable, by the patient's family).  Yes   Patient/family informed of their freedom to choose among providers that offer the needed level of care, that participate in Medicare, Medicaid or managed care program needed by the patient, have an available bed and are willing to accept the patient.  Yes   Patient/family informed of [place]'s ownership interest in [facility] and [facility], as well as of the fact that they are under no obligation to receive care at these facilities.  PASRR submitted to EDS on 02/12/15     PASRR number received on 02/12/15     Existing PASRR number confirmed on       FL2 transmitted to all facilities in geographic area requested by pt/family on 02/12/15     FL2 transmitted to all facilities within larger geographic area on 02/12/15     Patient informed that his/her managed care company has contracts with or will negotiate with certain facilities, including the following:        Yes   Patient/family informed of bed offers received.  Patient chooses bed at  ([facility] )     Physician recommends and patient chooses bed at      Patient to be transferred to  ([facility] ) on 02/15/15.  Patient to be transferred to facility by  [facility] )     Patient family notified on 02/15/15 of transfer.  Name of family member notified:   Baxter Flattery will call son)     PHYSICIAN       Additional Comment:     _______________________________________________ Loralyn Freshwater, LCSW 02/15/2015, 10:19 AM

## 2015-02-15 NOTE — Progress Notes (Signed)
CSW was notified by SNF that insurance Josem Kaufmann has been received this morning. CSW updated Pt's son who will meet Pt at SNF this morning.  DC packet and dc summary sent yesterday. Pt pleased with plan to go today.  No further CSW needs at this time. Pt DC to [facility].  Toma Copier, [place]

## 2015-02-15 NOTE — Clinical Social Work Placement (Signed)
   CLINICAL SOCIAL WORK PLACEMENT  NOTE  Date:  02/15/2015  Patient Details  Name: Jeremiah Bates MRN: [id number] Date of Birth: [birth year]  Clinical Social Work is seeking post-discharge placement for this patient at the [place] level of care (*CSW will initial, date and re-position this form in  chart as items are completed):  Yes   Patient/family provided with [place] Work Department's list of facilities offering this level of care within the geographic area requested by the patient (or if unable, by the patient's family).  Yes   Patient/family informed of their freedom to choose among providers that offer the needed level of care, that participate in Medicare, Medicaid or managed care program needed by the patient, have an available bed and are willing to accept the patient.  Yes   Patient/family informed of [place]'s ownership interest in [facility] and [facility], as well as of the fact that they are under no obligation to receive care at these facilities.  PASRR submitted to EDS on 02/12/15     PASRR number received on 02/12/15     Existing PASRR number confirmed on       FL2 transmitted to all facilities in geographic area requested by pt/family on 02/12/15     FL2 transmitted to all facilities within larger geographic area on 02/12/15     Patient informed that his/her managed care company has contracts with or will negotiate with certain facilities, including the following:        Yes   Patient/family informed of bed offers received.  Patient chooses bed at  ([facility] )     Physician recommends and patient chooses bed at      Patient to be transferred to  ([facility] ) on 02/15/15.  Patient to be transferred to facility by  [facility] )     Patient family notified on 02/15/15 of transfer.  Name of family member notified:   (Called [name])     PHYSICIAN       Additional Comment:     _______________________________________________ Alonna Buckler, LCSW 02/15/2015, 10:44 AM

## 2015-02-15 NOTE — Progress Notes (Signed)
Patient is medically stable for D/C to [facility] today. Per [facility] authorization has been received and patient is going to room 144-B. RN called report and arranged EMS for transport. Clinical Education officer, museum (CSW) prepared D/C packet and sent updated D/C Summary and follow up appointments to [facility] via carefinder. CSW met with met with patient and made him aware of above. Per Baxter Flattery she will call patient's son and made him aware of above. Please reconsult if future social work needs arise. CSW signing off.   Blima Rich, [place] [phone]

## 2015-02-15 NOTE — Progress Notes (Signed)
Nutrition Follow-up   INTERVENTION:   Meals and Snacks: Cater to patient preferences Medical Food Supplement Therapy: Continue supplement as ordered   NUTRITION DIAGNOSIS:   Inadequate oral intake related to acute illness as evidenced by meal completion < 25%; improving  GOAL:   Patient will meet greater than or equal to 90% of their needs; ongoing  MONITOR:    (Energy Intake, Anthropometrics, Digestive system)   ASSESSMENT:   Pt admitted after fall, s/p right hip pinning on 02/10/2015. Pt likely d/c today.   Diet Order:  Diet - low sodium heart healthy Diet renal with fluid restriction Fluid restriction:: 1200 mL Fluid; Room service appropriate?: Yes; Fluid consistency:: Thin    Current Nutrition: Pt asleep on visit this am with breakfast tray in front of him. Pt had eaten 75% of breakfast. Recorded po intake 65% of meals on average documented.   Gastrointestinal Profile: Last BM: 02/14/2015   Medications: Senokot, vitamin E, Protonix, Ferrous Sulfate  Electrolyte/Renal Profile and Glucose Profile:   Recent Labs Lab 02/10/15 0527 02/11/15 0333 02/12/15 0309 02/14/15 1133  NA 132* 132* 131* 130*  K 3.9 3.9 4.6  --   CL 98* 101 99*  --   CO2 25 26 27   --   BUN 26* 26* 29*  --   CREATININE 1.54* 1.60* 1.67*  --   CALCIUM 8.5* 8.0* 8.2*  --   GLUCOSE 93 101* 113*  --    Protein Profile:  Recent Labs Lab 02/09/15 1051  ALBUMIN 3.6     Weight Trend since Admission: Filed Weights   02/09/15 0945  Weight: 154 lb (69.854 kg)     Skin:  Reviewed, no issues  BMI:  Body mass index is 28.63 kg/(m^2).  Estimated Nutritional Needs:   Kcal:  1740-2045kcals, BEE: 1056kcals, TEE: (1.1-1.3)(AF 1.3)  Protein:  70-83g protein (1.0-1.2g/kg)  Fluid:  1745-2023mL of fluid (25-47mL/kg)   EDUCATION NEEDS:   No education needs identified at this time  [place], RD, LDN Pager [phone]

## 2015-02-15 NOTE — Progress Notes (Signed)
Physical Therapy Treatment Patient Details Name: Jeremiah Bates MRN: [id number] DOB: 24-Feb-1920 Today's Date: 02/15/2015    History of Present Illness This patient is a 81 year old male who came to [facility] after a fall suffering an R hip fracture and is now recieved an ORIF repair.    PT Comments    Pt in bed ready for transfer to skilled nursing facility; pt lethargic, but agreeable to PT. Pt continues to require active assisted range of motion for RLE. RLE noted to be stiff, but does loosen some with continued range. Pt to be discharged today to skilled nursing facility, and verified with SW this a.m.   Follow Up Recommendations  SNF     Equipment Recommendations       Recommendations for Other Services       Precautions / Restrictions Restrictions Weight Bearing Restrictions: Yes RLE Weight Bearing: Touchdown weight bearing    Mobility  Bed Mobility                  Transfers                    Ambulation/Gait                 Stairs            Wheelchair Mobility    Modified Rankin (Stroke Patients Only)       Balance                                    Cognition Arousal/Alertness: Lethargic Behavior During Therapy: WFL for tasks assessed/performed Overall Cognitive Status: Within Functional Limits for tasks assessed                      Exercises General Exercises - Lower Extremity Ankle Circles/Pumps: AROM;20 reps;Both;Supine Quad Sets: Strengthening;Both;15 reps;Supine Gluteal Sets: Strengthening;Both;20 reps;Supine Short Arc Quad: AROM;20 reps;Supine;Both Heel Slides: AAROM;Right;Supine;20 reps Hip ABduction/ADduction: AAROM;Right;Supine;20 reps Straight Leg Raises: AAROM;Supine;Both;20 reps    General Comments        Pertinent Vitals/Pain Pain Assessment: 0-10 Pain Score: 2  Pain Location: R hip Pain Intervention(s): Premedicated before session;Limited activity within patient's tolerance     Home Living                      Prior Function            PT Goals (current goals can now be found in the care plan section) Progress towards PT goals: Progressing toward goals (slowly)    Frequency  BID    PT Plan Current plan remains appropriate    Co-evaluation             End of Session   Activity Tolerance: Patient tolerated treatment well;Patient limited by lethargy Patient left: in bed;with call bell/phone within reach;with bed alarm set     Time: 5701-7793 PT Time Calculation (min) (ACUTE ONLY): 25 min  Charges:  $Therapeutic Exercise: 23-37 mins                    G Codes:      Charlaine Dalton 02/15/2015, 10:27 AM

## 2015-02-25 ENCOUNTER — Ambulatory Visit: Payer: Self-pay | Admitting: Family Medicine

## 2015-02-28 ENCOUNTER — Other Ambulatory Visit: Payer: Self-pay

## 2015-02-28 ENCOUNTER — Inpatient Hospital Stay: Payer: Medicare HMO | Admitting: Family Medicine

## 2015-02-28 DIAGNOSIS — K5790 Diverticulosis of intestine, part unspecified, without perforation or abscess without bleeding: Secondary | ICD-10-CM | POA: Insufficient documentation

## 2015-02-28 DIAGNOSIS — Z8546 Personal history of malignant neoplasm of prostate: Secondary | ICD-10-CM | POA: Insufficient documentation

## 2015-02-28 DIAGNOSIS — D649 Anemia, unspecified: Secondary | ICD-10-CM | POA: Insufficient documentation

## 2015-02-28 DIAGNOSIS — R471 Dysarthria and anarthria: Secondary | ICD-10-CM | POA: Insufficient documentation

## 2015-02-28 DIAGNOSIS — K648 Other hemorrhoids: Secondary | ICD-10-CM | POA: Insufficient documentation

## 2015-02-28 DIAGNOSIS — I10 Essential (primary) hypertension: Secondary | ICD-10-CM | POA: Insufficient documentation

## 2015-02-28 DIAGNOSIS — I6381 Other cerebral infarction due to occlusion or stenosis of small artery: Secondary | ICD-10-CM | POA: Insufficient documentation

## 2015-04-08 IMAGING — US US EXTREM LOW VENOUS*R*
1 series · 14 of 24 positions shown · non-contrast
Comparison: none

REASON FOR EXAM: rle pain swelling
COMMENTS:

[Series 1: us extrem low venous*right* · 0.09mm/px · 14 of 36 slices shown]
[im 1/36]
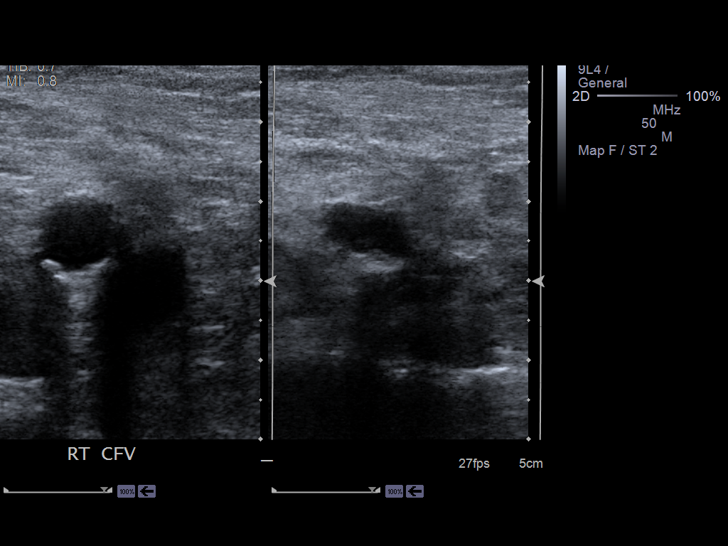
[im 4/36]
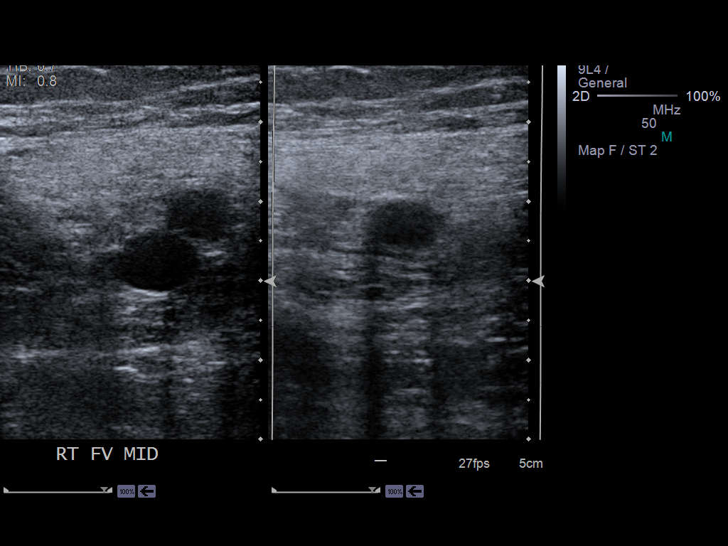
[im 7/36]
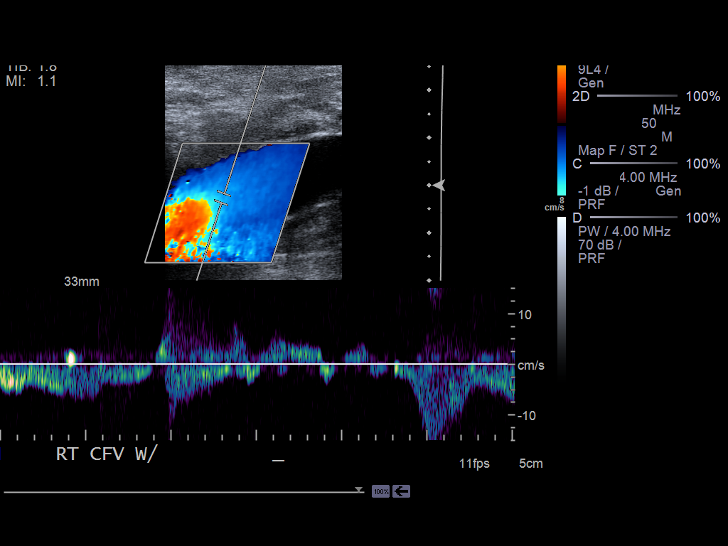
[im 10/36]
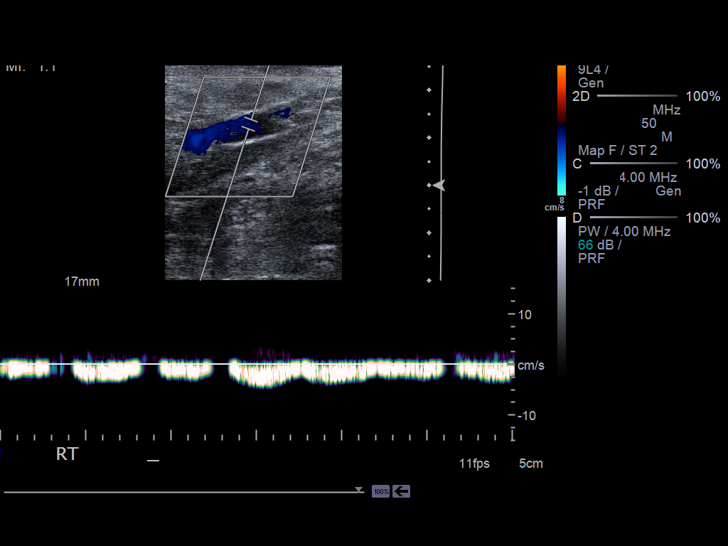
[im 11/36]
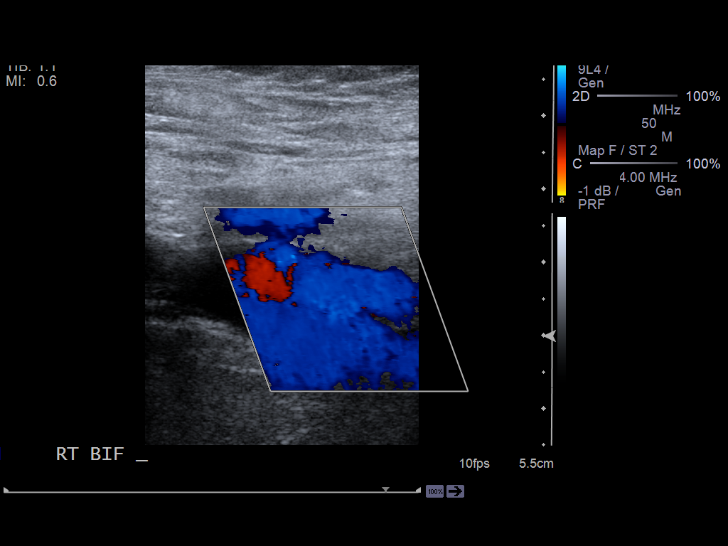
[im 14/36]
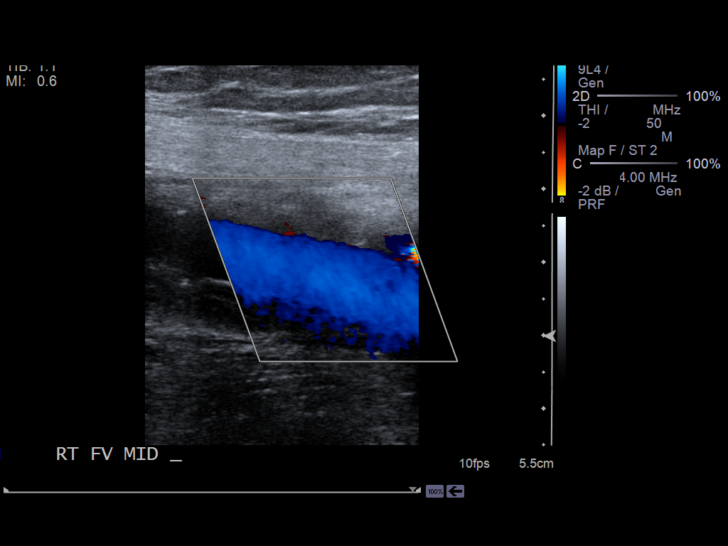
[im 17/36]
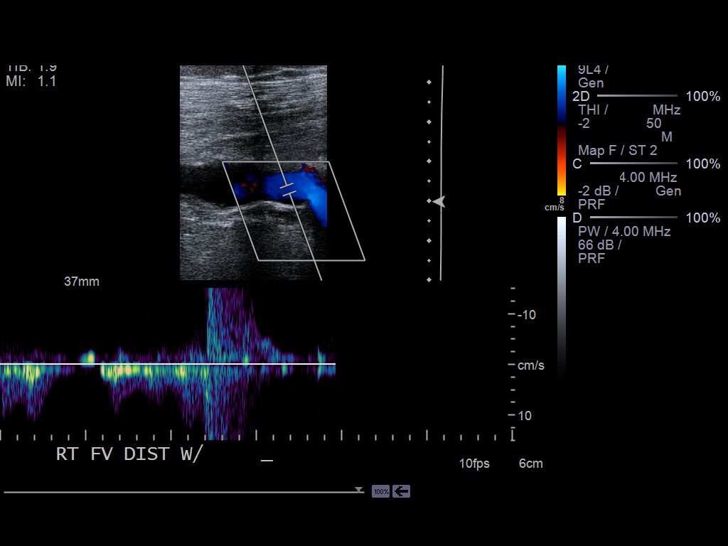
[im 19/36]
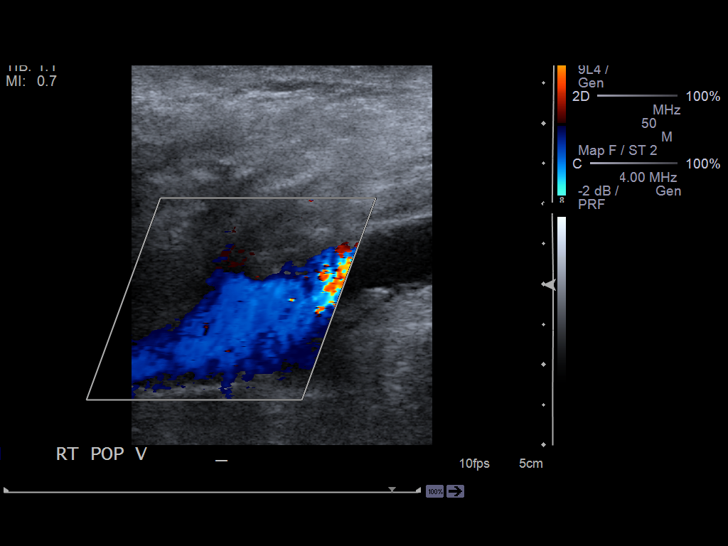
[im 22/36]
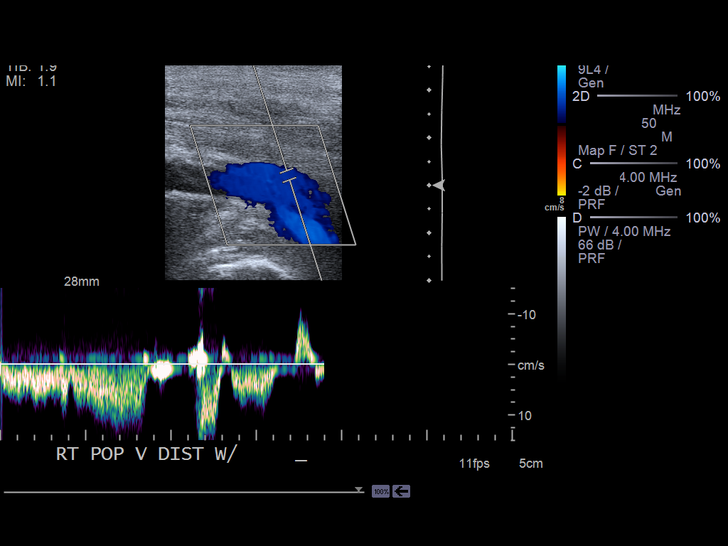
[im 25/36]
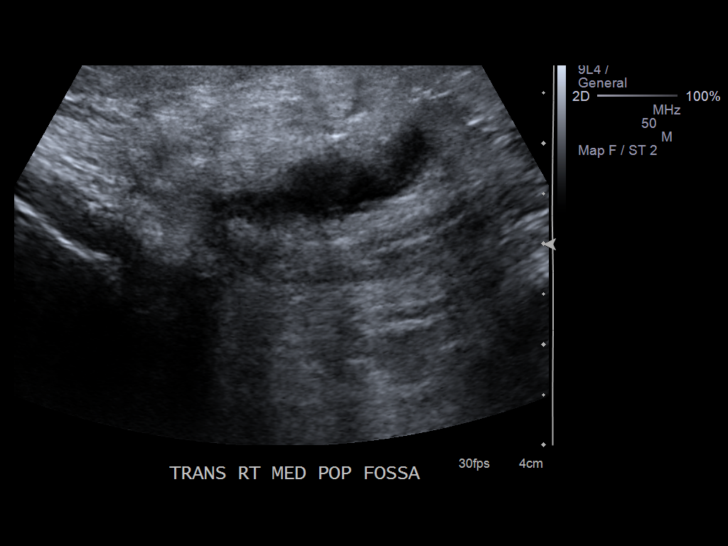
[im 28/36]
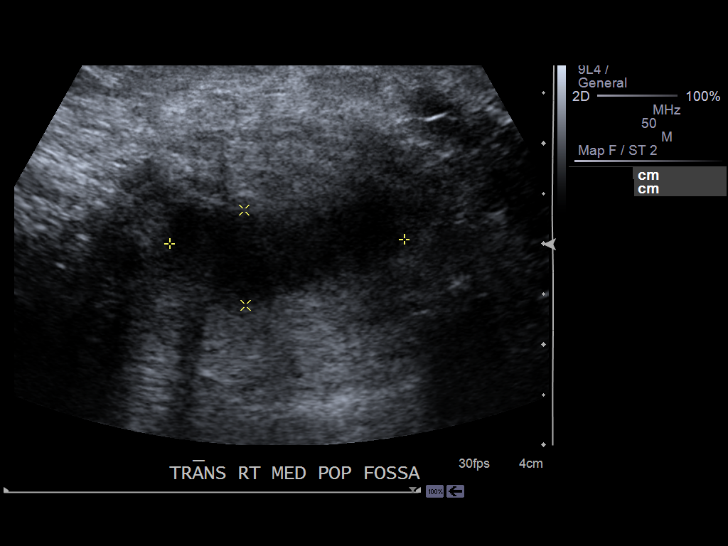
[im 29/36]
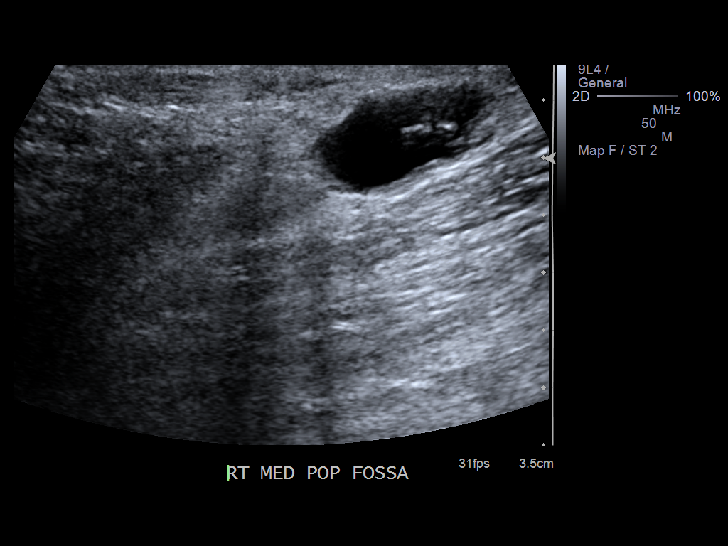
[im 32/36]
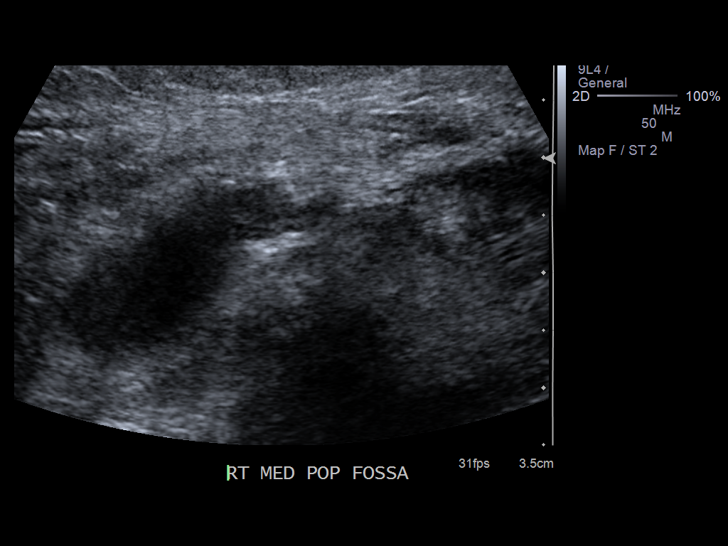
[im 36/36]
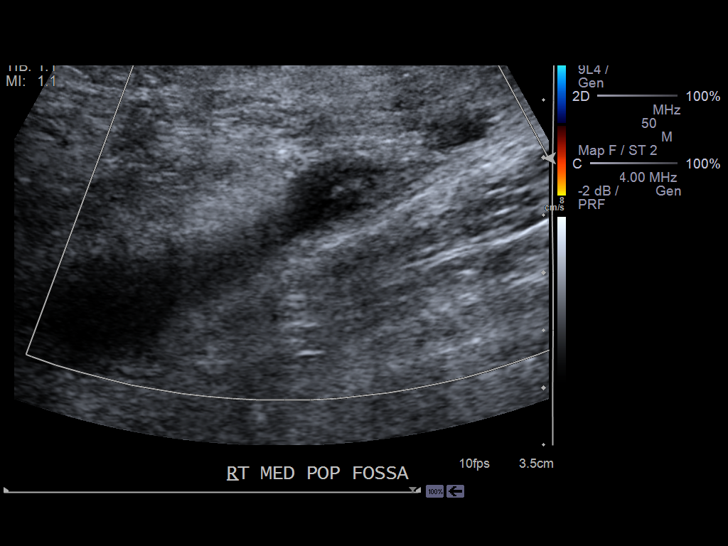

[14 of 24 positions shown; findings below may reference images not displayed]

PROCEDURE:     US  - US DOPPLER LOW EXTR RIGHT  - November 15, 2012 [DATE]

RESULT:     Grayscale and color flow Doppler techniques were employed to
evaluate the deep veins of the right lower extremity.

The right common femoral, superficial femoral, and popliteal veins are
normally compressible. The waveform patterns are normal and the color flow
images are normal. The response to the augmentation and Valsalva maneuver is
normal. In the medial aspect of the knee there is a complex cystic structure
measuring 2.3 x 1 x 3.7 cm compatible with a popliteal cyst.
IMPRESSION: 1. There is no evidence of thrombus within the right femoral or popliteal
veins.
2. There is likely a popliteal cyst present.

[REDACTED]

## 2015-06-20 ENCOUNTER — Telehealth: Payer: Self-pay | Admitting: Cardiovascular Disease

## 2015-06-20 NOTE — Telephone Encounter (Signed)
lmov to contact office .  Patient phone disconnected need to schedule recall appt. In Jan.  With Dr. Rockey Situ.

## 2015-06-30 ENCOUNTER — Other Ambulatory Visit: Payer: Self-pay | Admitting: Family Medicine

## 2016-04-18 ENCOUNTER — Other Ambulatory Visit: Payer: Self-pay | Admitting: Family Medicine

## 2017-08-02 DEATH — deceased
